# Patient Record
Sex: Female | Born: 1980
Health system: Southern US, Community
[De-identification: ages and names within clinical notes are randomized; demographics above are authoritative.]

## PROBLEM LIST (undated history)

## (undated) DIAGNOSIS — D72829 Elevated white blood cell count, unspecified: Secondary | ICD-10-CM

## (undated) DIAGNOSIS — R030 Elevated blood-pressure reading, without diagnosis of hypertension: Secondary | ICD-10-CM

## (undated) DIAGNOSIS — F419 Anxiety disorder, unspecified: Secondary | ICD-10-CM

## (undated) DIAGNOSIS — J309 Allergic rhinitis, unspecified: Secondary | ICD-10-CM

## (undated) DIAGNOSIS — T7840XA Allergy, unspecified, initial encounter: Secondary | ICD-10-CM

## (undated) DIAGNOSIS — J45909 Unspecified asthma, uncomplicated: Secondary | ICD-10-CM

## (undated) DIAGNOSIS — D649 Anemia, unspecified: Secondary | ICD-10-CM

## (undated) DIAGNOSIS — M25519 Pain in unspecified shoulder: Secondary | ICD-10-CM

## (undated) DIAGNOSIS — Z8781 Personal history of (healed) traumatic fracture: Secondary | ICD-10-CM

## (undated) DIAGNOSIS — E559 Vitamin D deficiency, unspecified: Secondary | ICD-10-CM

## (undated) DIAGNOSIS — R000 Tachycardia, unspecified: Secondary | ICD-10-CM

## (undated) DIAGNOSIS — K219 Gastro-esophageal reflux disease without esophagitis: Secondary | ICD-10-CM

## (undated) HISTORY — DX: Elevated blood-pressure reading, without diagnosis of hypertension: R03.0

## (undated) HISTORY — DX: Pain in unspecified shoulder: M25.519

## (undated) HISTORY — DX: Unspecified asthma, uncomplicated: J45.909

## (undated) HISTORY — DX: Allergy, unspecified, initial encounter: T78.40XA

## (undated) HISTORY — DX: Allergic rhinitis, unspecified: J30.9

## (undated) HISTORY — DX: Personal history of (healed) traumatic fracture: Z87.81

## (undated) HISTORY — DX: Elevated white blood cell count, unspecified: D72.829

## (undated) HISTORY — DX: Vitamin D deficiency, unspecified: E55.9

## (undated) HISTORY — DX: Gastro-esophageal reflux disease without esophagitis: K21.9

## (undated) HISTORY — DX: Anemia, unspecified: D64.9

---

## 2005-12-10 ENCOUNTER — Ambulatory Visit: Payer: Self-pay | Admitting: Family Medicine

## 2007-05-23 ENCOUNTER — Emergency Department: Payer: Self-pay | Admitting: Emergency Medicine

## 2007-05-23 ENCOUNTER — Other Ambulatory Visit: Payer: Self-pay

## 2011-06-26 ENCOUNTER — Emergency Department: Payer: Self-pay | Admitting: Emergency Medicine

## 2011-06-27 ENCOUNTER — Encounter: Payer: Self-pay | Admitting: Cardiovascular Disease

## 2011-06-27 ENCOUNTER — Ambulatory Visit (INDEPENDENT_AMBULATORY_CARE_PROVIDER_SITE_OTHER): Payer: BC Managed Care – PPO | Admitting: Cardiovascular Disease

## 2011-06-27 VITALS — BP 100/72 | HR 113 | Ht 66.0 in | Wt 155.0 lb

## 2011-06-27 DIAGNOSIS — R002 Palpitations: Secondary | ICD-10-CM

## 2011-06-27 DIAGNOSIS — R000 Tachycardia, unspecified: Secondary | ICD-10-CM

## 2011-06-27 MED ORDER — PROPRANOLOL HCL 20 MG PO TABS: 20.0000 mg | ORAL_TABLET | Freq: Three times a day (TID) | ORAL | Status: DC | PRN

## 2011-06-27 NOTE — Assessment & Plan Note (Signed)
She appears to have sinus tachycardia on EKG. I suspect this is secondary to underlying stress. As I reassured her that clinically she was doing well, her heart did seem to improve with a slower rate. We talked about each of the issues and she does have significant stress that is ongoing. I would agree with Ativan p.r.n. And I have given her a prescription for propranolol p.r.n.Marland Kitchen She can try 10 mg or 20 mg with titration upwards if needed for rate control. No additional workup is probably needed at this time given that the EKG is essentially benign apart from the rate and clinically on auscultation everything sounds appropriate. I think there is no sign of significant LVH or hypertrophy on EKG or clinical exam ( no murmur to suggest HOCM). I do not think that further workup is needed at this time she starts having additional symptoms.

## 2011-06-27 NOTE — Patient Instructions (Signed)
You are doing well. Please try propranolol as needed. You could start 1/2 pill to start. You can take up to a full pill 3 to 4 times a day.  Please call us if you have new issues that need to be addressed before your next appt.

## 2011-06-27 NOTE — Progress Notes (Signed)
Patient ID: Mackenzie Shea, female    DOB: 1980-09-20, 30 y.o.   MRN: [id number]  HPI Comments: Mackenzie Shea is a very pleasant 30 year old woman who presents by referral from Dr. Carlynn Purl for tachycardia.   She reports that she has had significant stress recently. She is taking care of her grandfather at home though recently he has been in the hospital for one month with several tumors that have had to be resected. He has had other complications and has required additional surgery. She has been much of her month in the hospital though now has gone back to work and does spend some of her evenings in the hospital.  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 This happened this week. Her sleep has been poor secondary to stress. She did take Xopenex yesterday for mild asthma but today it is much better.   She was told that her heart rate is very elevated and she is concerned given her family history of sudden death. She does have occasional palpitations that feels like a flip-flop. She was given Ativan p.r.n. For anxiety and this has helped to relieve some of her stress.  EKG today showsSinus tachycardia with rate 100 beats per minute with no significant ST or T wave changes   Outpatient Encounter Prescriptions as of 06/27/2011  Medication Sig Dispense Refill  . cyclobenzaprine (FLEXERIL) 10 MG tablet Take 10 mg by mouth 3 (three) times daily as needed.        . fluticasone (VERAMYST) 27.5 MCG/SPRAY nasal spray Place 2 sprays into the nose daily.        Marland Kitchen levalbuterol (XOPENEX) 1.25 MG/3ML nebulizer solution Take 1.25 mg by nebulization every 4 (four) hours as needed.        . montelukast (SINGULAIR) 10 MG tablet Take 10 mg by mouth at bedtime.        Marland Kitchen omeprazole (PRILOSEC) 20 MG capsule Take 20 mg by mouth daily.        . ranitidine (ZANTAC) 150 MG tablet Take 150 mg by mouth 2 (two) times daily.        .  traMADol-acetaminophen (ULTRACET) 37.5-325 MG per tablet Take 1 tablet by mouth every 6 (six) hours as needed.           Review of Systems  Constitutional: Negative.   HENT: Negative.   Eyes: Negative.   Respiratory: Negative.   Cardiovascular: Positive for palpitations.  Gastrointestinal: Negative.   Musculoskeletal: Negative.   Skin: Negative.   Neurological: Negative.   Hematological: Negative.   Psychiatric/Behavioral: The patient is nervous/anxious.   All other systems reviewed and are negative.    BP 100/72  Pulse 113  Ht 5\' 6"  (1.676 m)  Wt 155 lb (70.308 kg)  BMI 25.02 kg/m2  Physical Exam  Nursing note and vitals reviewed. Constitutional: She is oriented to person, place, and time. She appears well-developed and well-nourished.  HENT:  Head: Normocephalic.  Nose: Nose normal.  Mouth/Throat: Oropharynx is clear and moist.  Eyes: Conjunctivae are normal. Pupils are equal, round, and reactive to light.  Neck: Normal range of motion. Neck supple. No JVD present.  Cardiovascular: Regular rhythm, S1 normal, S2 normal, normal heart sounds and intact distal pulses.  Tachycardia present.  Exam reveals no gallop and no friction rub.   No murmur heard. Pulmonary/Chest: Effort normal and breath sounds normal. No respiratory distress. She has no wheezes. She has  no rales. She exhibits no tenderness.  Abdominal: Soft. Bowel sounds are normal. She exhibits no distension. There is no tenderness.  Musculoskeletal: Normal range of motion. She exhibits no edema and no tenderness.  Lymphadenopathy:    She has no cervical adenopathy.  Neurological: She is alert and oriented to person, place, and time. Coordination normal.  Skin: Skin is warm and dry. No rash noted. No erythema.  Psychiatric: She has a normal mood and affect. Her behavior is normal. Judgment and thought content normal.         Assessment and Plan

## 2011-08-28 ENCOUNTER — Ambulatory Visit: Payer: BC Managed Care – PPO | Admitting: Family Medicine

## 2011-08-29 ENCOUNTER — Ambulatory Visit: Payer: BC Managed Care – PPO | Admitting: Family Medicine

## 2011-10-02 ENCOUNTER — Ambulatory Visit (INDEPENDENT_AMBULATORY_CARE_PROVIDER_SITE_OTHER): Payer: BC Managed Care – PPO | Admitting: Family Medicine

## 2011-10-02 ENCOUNTER — Other Ambulatory Visit (HOSPITAL_COMMUNITY)
Admission: RE | Admit: 2011-10-02 | Discharge: 2011-10-02 | Disposition: A | Discharge status: Home / Self Care | Payer: BC Managed Care – PPO | Source: Ambulatory Visit | Attending: Family Medicine | Admitting: Family Medicine

## 2011-10-02 ENCOUNTER — Encounter: Payer: Self-pay | Admitting: *Deleted

## 2011-10-02 ENCOUNTER — Encounter: Payer: Self-pay | Admitting: Family Medicine

## 2011-10-02 VITALS — BP 132/82 | HR 98 | Temp 98.3°F | Ht 66.0 in | Wt 157.0 lb

## 2011-10-02 DIAGNOSIS — E559 Vitamin D deficiency, unspecified: Secondary | ICD-10-CM | POA: Insufficient documentation

## 2011-10-02 DIAGNOSIS — Z0000 Encounter for general adult medical examination without abnormal findings: Secondary | ICD-10-CM | POA: Insufficient documentation

## 2011-10-02 DIAGNOSIS — R102 Pelvic and perineal pain: Secondary | ICD-10-CM

## 2011-10-02 DIAGNOSIS — Z01419 Encounter for gynecological examination (general) (routine) without abnormal findings: Secondary | ICD-10-CM | POA: Insufficient documentation

## 2011-10-02 DIAGNOSIS — Z136 Encounter for screening for cardiovascular disorders: Secondary | ICD-10-CM

## 2011-10-02 DIAGNOSIS — R109 Unspecified abdominal pain: Secondary | ICD-10-CM

## 2011-10-02 DIAGNOSIS — Z1159 Encounter for screening for other viral diseases: Secondary | ICD-10-CM | POA: Insufficient documentation

## 2011-10-02 LAB — POCT URINALYSIS DIPSTICK
Bilirubin, UA: NEGATIVE
Blood, UA: NEGATIVE
Glucose, UA: NEGATIVE
Ketones, UA: NEGATIVE
Leukocytes, UA: NEGATIVE
Nitrite, UA: NEGATIVE
Protein, UA: NEGATIVE
Spec Grav, UA: 1.02
Urobilinogen, UA: NEGATIVE
pH, UA: 6

## 2011-10-02 LAB — LIPID PANEL
Cholesterol: 143 mg/dL (ref 0–200)
HDL: 50.2 mg/dL
LDL Cholesterol: 81 mg/dL (ref 0–99)
Total CHOL/HDL Ratio: 3
Triglycerides: 61 mg/dL (ref 0.0–149.0)
VLDL: 12.2 mg/dL (ref 0.0–40.0)

## 2011-10-02 LAB — BASIC METABOLIC PANEL
BUN: 9 mg/dL (ref 6–23)
Creatinine, Ser: 1.1 mg/dL (ref 0.4–1.2)
GFR: 77.37 mL/min (ref >60.00)
Glucose, Bld: 95 mg/dL (ref 70–99)
Potassium: 4.4 mEq/L (ref 3.5–5.1)

## 2011-10-02 MED ORDER — CITALOPRAM HYDROBROMIDE 10 MG PO TABS: 10.0000 mg | ORAL_TABLET | Freq: Every day | ORAL | Status: DC

## 2011-10-02 MED ORDER — ALPRAZOLAM 0.25 MG PO TABS: 0.2500 mg | ORAL_TABLET | Freq: Two times a day (BID) | ORAL | Status: DC | PRN

## 2011-10-02 NOTE — Progress Notes (Signed)
Subjective:    Patient ID: Mackenzie Shea, female    DOB: 1981/01/02, 31 y.o.   MRN: [id number]  HPI  31 yo here to establish care and for CPX.  G0, virginal.  Never had a pap smear.  Had some palpitations in October, saw Dr. Mariah Milling.  Felt it was secondary to stress as grandfather was dying. Has had no recurrent episodes.  Has had some suprapubic pressure last few days.  No dysuria, no back pain, no n/v/d.  Has not had cholesterol checked in years.  Vit D deficiency- was previously taking 2000 IU daily.  Has not been taking it in awhile.  Asthma- has been stable.  On Veramyst and Singulair with as needed xopenex.  Patient Active Problem List  Diagnoses  . Tachycardia  . Vitamin d deficiency  . Routine general medical examination at a health care facility   Past Medical History  Diagnosis Date  . Personal history of traumatic fracture   . Esophageal reflux   . Extrinsic asthma, unspecified   . Unspecified vitamin D deficiency   . Allergic rhinitis, cause unspecified   . Leukocytosis, unspecified   . Elevated blood pressure reading without diagnosis of hypertension   . Pain in joint, shoulder region    No past surgical history on file. History  Substance Use Topics  . Smoking status: Never Smoker   . Smokeless tobacco: Not on file  . Alcohol Use: No   No family history on file. Allergies  Allergen Reactions  . Penicillins Hives   Current Outpatient Prescriptions on File Prior to Visit  Medication Sig Dispense Refill  . cyclobenzaprine (FLEXERIL) 10 MG tablet Take 10 mg by mouth 3 (three) times daily as needed.        . fluticasone (VERAMYST) 27.5 MCG/SPRAY nasal spray Place 2 sprays into the nose daily.        Marland Kitchen levalbuterol (XOPENEX) 1.25 MG/3ML nebulizer solution Take 1.25 mg by nebulization every 4 (four) hours as needed.        . montelukast (SINGULAIR) 10 MG tablet Take 10 mg by mouth at bedtime.        Marland Kitchen omeprazole (PRILOSEC) 20 MG capsule Take 20 mg by mouth  daily.        . propranolol (INDERAL) 20 MG tablet Take 1 tablet (20 mg total) by mouth 3 (three) times daily as needed.  90 tablet  6  . ranitidine (ZANTAC) 150 MG tablet Take 150 mg by mouth 2 (two) times daily.        . traMADol-acetaminophen (ULTRACET) 37.5-325 MG per tablet Take 1 tablet by mouth every 6 (six) hours as needed.         The PMH, PSH, Social History, Family History, Medications, and allergies have been reviewed in [facility], and have been updated if relevant.   Review of Systems    See HPI Patient reports no  vision/ hearing changes,anorexia, weight change, fever ,adenopathy, persistant / recurrent hoarseness, swallowing issues, chest pain, edema,persistant / recurrent cough, hemoptysis, dyspnea(rest, exertional, paroxysmal nocturnal), gastrointestinal  bleeding (melena, rectal bleeding), abdominal pain, excessive heart burn, GU symptoms(dysuria, hematuria, pyuria, voiding/incontinence  Issues) syncope, focal weakness, severe memory loss, concerning skin lesions, depression, anxiety, abnormal bruising/bleeding, major joint swelling, breast masses or abnormal vaginal bleeding.    Objective:   Physical Exam BP 132/82  Pulse 98  Temp(Src) 98.3 F (36.8 C) (Oral)  Ht 5\' 6"  (1.676 m)  Wt 157 lb (71.215 kg)  BMI 25.34 kg/m2  LMP 09/01/2011  General:  Well-developed,well-nourished,in no acute distress; alert,appropriate and cooperative throughout examination Head:  normocephalic and atraumatic.   Eyes:  vision grossly intact, pupils equal, pupils round, and pupils reactive to light.   Ears:  R ear normal and L ear normal.   Nose:  no external deformity.   Mouth:  good dentition.   Neck:  No deformities, masses, or tenderness noted. Breasts:  No mass, nodules, thickening, tenderness, bulging, retraction, inflamation, nipple discharge or skin changes noted.   Lungs:  Normal respiratory effort, chest expands symmetrically. Lungs are clear to auscultation, no crackles or  wheezes. Heart:  Normal rate and regular rhythm. S1 and S2 normal without gallop, murmur, click, rub or other extra sounds. Abdomen:  Bowel sounds positive,abdomen soft and non-tender without masses, organomegaly or hernias noted. Rectal:  no external abnormalities.   Genitalia:  Pelvic Exam:        External: normal female genitalia without lesions or masses        Vagina: normal without lesions or masses        Cervix: normal without lesions or masses        Adnexa: normal bimanual exam without masses or fullness        Uterus: normal by palpation        Pap smear: performed Msk:  No deformity or scoliosis noted of thoracic or lumbar spine.   Extremities:  No clubbing, cyanosis, edema, or deformity noted with normal full range of motion of all joints.   Neurologic:  alert & oriented X3 and gait normal.   Skin:  Intact without suspicious lesions or rashes Cervical Nodes:  No lymphadenopathy noted Axillary Nodes:  No palpable lymphadenopathy Psych:  Cognition and judgment appear intact. Alert and cooperative with normal attention span and concentration. No apparent delusions, illusions, hallucinations  Assessment and Plan: 1. Vitamin d deficiency  Vitamin D (25 hydroxy)  2. Routine general medical examination at a health care facility   Reviewed preventive care protocols, scheduled due services, and updated immunizations Discussed nutrition, exercise, diet, and healthy lifestyle.  Lipid Profile Basic Metabolic Panel (BMET), Cytology -Pap Smear  3. Suprapubic pressure   UA neg.  Advised to continue to monitor.

## 2011-10-02 NOTE — Patient Instructions (Signed)
It was so nice to meet you. We will call you with the results of your labs and your pap smear (pap smear results can take up to a week).

## 2011-10-03 ENCOUNTER — Encounter: Payer: Self-pay | Admitting: *Deleted

## 2011-10-03 LAB — VITAMIN D 25 HYDROXY (VIT D DEFICIENCY, FRACTURES): Vit D, 25-Hydroxy: 42 ng/mL (ref 30–89)

## 2011-10-09 ENCOUNTER — Encounter: Payer: Self-pay | Admitting: *Deleted

## 2011-11-03 ENCOUNTER — Encounter: Payer: Self-pay | Admitting: Family Medicine

## 2011-11-03 ENCOUNTER — Ambulatory Visit (INDEPENDENT_AMBULATORY_CARE_PROVIDER_SITE_OTHER): Payer: BC Managed Care – PPO | Admitting: Family Medicine

## 2011-11-03 VITALS — BP 130/88 | HR 84 | Temp 98.4°F | Wt 156.0 lb

## 2011-11-03 DIAGNOSIS — J329 Chronic sinusitis, unspecified: Secondary | ICD-10-CM

## 2011-11-03 DIAGNOSIS — J029 Acute pharyngitis, unspecified: Secondary | ICD-10-CM

## 2011-11-03 LAB — POCT RAPID STREP A (OFFICE): Rapid Strep A Screen: NEGATIVE

## 2011-11-03 MED ORDER — AZITHROMYCIN 250 MG PO TABS: ORAL_TABLET | ORAL | Status: AC

## 2011-11-03 NOTE — Progress Notes (Signed)
SUBJECTIVE:  Mackenzie Shea is a 31 y.o. female who complains of coryza, congestion, sore throat, dry cough and bilateral sinus pain for 21 days. She denies a history of anorexia and chest pain and denies a history of asthma. Patient denies smoke cigarettes.   Patient Active Problem List  Diagnoses  . Tachycardia  . Vitamin d deficiency  . Routine general medical examination at a health care facility  . Suprapubic pressure  . Sinusitis   Past Medical History  Diagnosis Date  . Personal history of traumatic fracture   . Esophageal reflux   . Extrinsic asthma, unspecified   . Unspecified vitamin D deficiency   . Allergic rhinitis, cause unspecified   . Leukocytosis, unspecified   . Elevated blood pressure reading without diagnosis of hypertension   . Pain in joint, shoulder region    No past surgical history on file. History  Substance Use Topics  . Smoking status: Never Smoker   . Smokeless tobacco: Not on file  . Alcohol Use: No   No family history on file. Allergies  Allergen Reactions  . Penicillins Hives   Current Outpatient Prescriptions on File Prior to Visit  Medication Sig Dispense Refill  . cetirizine (ZYRTEC) 10 MG tablet Take 10 mg by mouth at bedtime.      . Cholecalciferol (VITAMIN D3) 2000 UNITS TABS Take 1 tablet by mouth daily.      . cyclobenzaprine (FLEXERIL) 10 MG tablet Take 10 mg by mouth 3 (three) times daily as needed.        . fluticasone (VERAMYST) 27.5 MCG/SPRAY nasal spray Place 2 sprays into the nose daily.        . Fluticasone-Salmeterol (ADVAIR DISKUS) 100-50 MCG/DOSE AEPB Inhale 1 puff into the lungs 2 (two) times daily.      Marland Kitchen levalbuterol (XOPENEX HFA) 45 MCG/ACT inhaler Inhale 1-2 puffs into the lungs every 4 (four) hours as needed.      . levalbuterol (XOPENEX) 1.25 MG/3ML nebulizer solution Take 1.25 mg by nebulization every 4 (four) hours as needed.        Marland Kitchen LORazepam (ATIVAN) 0.5 MG tablet Take 0.5 mg by mouth daily as needed.      .  meclizine (ANTIVERT) 25 MG tablet Take 25 mg by mouth 3 (three) times daily as needed.      . montelukast (SINGULAIR) 10 MG tablet Take 10 mg by mouth at bedtime.        . naproxen (NAPROSYN) 500 MG tablet Take 500 mg by mouth 2 (two) times daily with a meal.      . omeprazole (PRILOSEC) 20 MG capsule Take 20 mg by mouth daily.        . propranolol (INDERAL) 20 MG tablet Take 1 tablet (20 mg total) by mouth 3 (three) times daily as needed.  90 tablet  6  . ranitidine (ZANTAC) 150 MG tablet Take 150 mg by mouth 2 (two) times daily.        . traMADol-acetaminophen (ULTRACET) 37.5-325 MG per tablet Take 1 tablet by mouth every 6 (six) hours as needed.         The PMH, PSH, Social History, Family History, Medications, and allergies have been reviewed in [facility], and have been updated if relevant.  OBJECTIVE: BP 130/88  Pulse 84  Temp(Src) 98.4 F (36.9 C) (Oral)  Wt 156 lb (70.761 kg)  She appears well, vital signs are as noted. Ears normal.  Throat and pharynx normal.  Neck supple. No adenopathy in the  neck. Nose is congested. Sinuses tender throughout. The chest is clear, without wheezes or rales.  ASSESSMENT:  sinusitis  PLAN: Given duration and progression of symptoms, will treat for bacterial sinusitis with Zpack (PCN allergic). Symptomatic therapy suggested: push fluids, rest and return office visit prn if symptoms persist or worsen.  Call or return to clinic prn if these symptoms worsen or fail to improve as anticipated.

## 2011-11-03 NOTE — Patient Instructions (Signed)
Take antibiotic as directed.  Drink lots of fluids.  Treat sympotmatically with Mucinex, nasal saline irrigation, and Tylenol/Ibuprofen. Also try claritin D or zyrtec D over the counter- two times a day as needed ( have to sign for them at pharmacy). You can use warm compresses.  Cough suppressant at night. Call if not improving as expected in 5-7 days.    

## 2011-11-03 NOTE — Progress Notes (Signed)
Addended by: Eliezer Bottom on: 11/03/2011 10:22 AM   Modules accepted: Orders

## 2011-12-25 ENCOUNTER — Other Ambulatory Visit: Payer: Self-pay

## 2011-12-25 MED ORDER — LEVALBUTEROL TARTRATE 45 MCG/ACT IN AERO: 1.0000 | INHALATION_SPRAY | RESPIRATORY_TRACT | Status: DC | PRN

## 2011-12-25 NOTE — Telephone Encounter (Signed)
Pt request refill Xopenex HFA 45 mcg/act inhaler  #1 x 3.sent to [facility] [address]. Pt seen 11/03/11 and forgot to get refills to have on hand if needed. Pt said not having a problem just wanted to have on hand.Pt notified sent in while on phone.

## 2012-02-18 ENCOUNTER — Ambulatory Visit (INDEPENDENT_AMBULATORY_CARE_PROVIDER_SITE_OTHER): Payer: BC Managed Care – PPO | Admitting: Family Medicine

## 2012-02-18 ENCOUNTER — Encounter: Payer: Self-pay | Admitting: Family Medicine

## 2012-02-18 VITALS — BP 142/80 | HR 92 | Temp 98.2°F | Wt 158.0 lb

## 2012-02-18 DIAGNOSIS — R21 Rash and other nonspecific skin eruption: Secondary | ICD-10-CM

## 2012-02-18 MED ORDER — PREDNISONE 20 MG PO TABS: ORAL_TABLET | ORAL | Status: DC

## 2012-02-18 MED ORDER — LORAZEPAM 0.5 MG PO TABS: 0.5000 mg | ORAL_TABLET | Freq: Every day | ORAL | Status: DC | PRN

## 2012-02-18 MED ORDER — MECLIZINE HCL 25 MG PO TABS: 25.0000 mg | ORAL_TABLET | Freq: Three times a day (TID) | ORAL | Status: DC | PRN

## 2012-02-18 MED ORDER — LEVALBUTEROL TARTRATE 45 MCG/ACT IN AERO: 1.0000 | INHALATION_SPRAY | RESPIRATORY_TRACT | Status: DC | PRN

## 2012-02-18 MED ORDER — FLUOCINONIDE-E 0.05 % EX CREA: TOPICAL_CREAM | Freq: Two times a day (BID) | CUTANEOUS | Status: DC

## 2012-02-18 NOTE — Progress Notes (Signed)
Subjective:    Patient ID: Mackenzie Shea, female    DOB: 06-24-1981, 31 y.o.   MRN: [id number]  HPI  31 yo here for itchy rash.  Went to beach over the weekend and returned with diffuse, papular, itchy rash. Symptoms mildly improve with Benadryl and OTC cortisone cream.  No one else who stayed with her has similar symptoms.  She was swimming a lot in pool and has sensitivities to chlorine.  Patient Active Problem List  Diagnosis  . Tachycardia  . Vitamin d deficiency  . Routine general medical examination at a health care facility  . Suprapubic pressure  . Sinusitis  . Rash and nonspecific skin eruption   Past Medical History  Diagnosis Date  . Personal history of traumatic fracture   . Esophageal reflux   . Extrinsic asthma, unspecified   . Unspecified vitamin D deficiency   . Allergic rhinitis, cause unspecified   . Leukocytosis, unspecified   . Elevated blood pressure reading without diagnosis of hypertension   . Pain in joint, shoulder region    No past surgical history on file. History  Substance Use Topics  . Smoking status: Never Smoker   . Smokeless tobacco: Not on file  . Alcohol Use: No   No family history on file. Allergies  Allergen Reactions  . Penicillins Hives   Current Outpatient Prescriptions on File Prior to Visit  Medication Sig Dispense Refill  . cetirizine (ZYRTEC) 10 MG tablet Take 10 mg by mouth at bedtime.      . Cholecalciferol (VITAMIN D3) 2000 UNITS TABS Take 1 tablet by mouth daily.      . cyclobenzaprine (FLEXERIL) 10 MG tablet Take 10 mg by mouth 3 (three) times daily as needed.        . fluticasone (VERAMYST) 27.5 MCG/SPRAY nasal spray Place 2 sprays into the nose daily.        . Fluticasone-Salmeterol (ADVAIR DISKUS) 100-50 MCG/DOSE AEPB Inhale 1 puff into the lungs 2 (two) times daily.      Marland Kitchen levalbuterol (XOPENEX) 1.25 MG/3ML nebulizer solution Take 1.25 mg by nebulization every 4 (four) hours as needed.        . montelukast  (SINGULAIR) 10 MG tablet Take 10 mg by mouth at bedtime.        Marland Kitchen omeprazole (PRILOSEC) 20 MG capsule Take 20 mg by mouth daily.        . propranolol (INDERAL) 20 MG tablet Take 1 tablet (20 mg total) by mouth 3 (three) times daily as needed.  90 tablet  6  . ranitidine (ZANTAC) 150 MG tablet Take 150 mg by mouth 2 (two) times daily.        Marland Kitchen DISCONTD: levalbuterol (XOPENEX HFA) 45 MCG/ACT inhaler Inhale 1-2 puffs into the lungs every 4 (four) hours as needed.  1 Inhaler  3   The PMH, PSH, Social History, Family History, Medications, and allergies have been reviewed in [facility], and have been updated if relevant.   Review of Systems See HPI No wheezing    Objective:   Physical Exam  Constitutional: She appears well-developed and well-nourished. No distress.  HENT:  Head: Normocephalic.  Skin: Skin is warm, dry and intact. Rash noted. Rash is urticarial.     Psychiatric: She has a normal mood and affect. Her behavior is normal.   BP 142/80  Pulse 92  Temp 98.2 F (36.8 C) (Oral)  Wt 158 lb (71.668 kg)       Assessment & Plan:   1.  Rash and nonspecific skin eruption    New- appears allergic. ?reaction to chlorine. Given diffuse nature, will give oral prednisone taper, continue antihistamines. If no improvement by Friday, consider ?scabies or bed bugs, although not classic distribution or lesions- no one else who stayed in hotel have these lesions which makes it less likely as well.

## 2012-02-18 NOTE — Patient Instructions (Addendum)
I think this is an allergic reaction( probably to chlorine). Take prednisone as directed. Use lidex as directed. Continue benadrly and or zyrtec. If this is no better by Friday, call me, we will treat for scabies.

## 2012-02-27 ENCOUNTER — Telehealth: Payer: Self-pay

## 2012-02-27 MED ORDER — PERMETHRIN 5 % EX CREA: TOPICAL_CREAM | CUTANEOUS | Status: DC

## 2012-02-27 NOTE — Telephone Encounter (Signed)
I completely agree. Scabies was on the differential so if she is no better, we do need to treat for scabies. I will send rx to her pharmacy.

## 2012-02-27 NOTE — Telephone Encounter (Signed)
Advised patient. She will call back if problems continue.

## 2012-02-27 NOTE — Telephone Encounter (Signed)
Pt seen 02/18/12; rash no better, new areas of rash also. Pt finished med; thinks needs med for scabies. [facility] [address]Please advise.

## 2012-03-01 ENCOUNTER — Other Ambulatory Visit: Payer: Self-pay | Admitting: Family Medicine

## 2012-04-22 ENCOUNTER — Other Ambulatory Visit: Payer: Self-pay | Admitting: *Deleted

## 2012-04-22 MED ORDER — MONTELUKAST SODIUM 10 MG PO TABS: 10.0000 mg | ORAL_TABLET | Freq: Every day | ORAL | Status: DC

## 2012-05-04 ENCOUNTER — Other Ambulatory Visit: Payer: Self-pay | Admitting: *Deleted

## 2012-05-04 MED ORDER — OMEPRAZOLE 20 MG PO CPDR: 20.0000 mg | DELAYED_RELEASE_CAPSULE | Freq: Every day | ORAL | Status: DC

## 2012-09-16 ENCOUNTER — Other Ambulatory Visit: Payer: Self-pay | Admitting: Family Medicine

## 2012-11-30 ENCOUNTER — Telehealth: Payer: Self-pay | Admitting: *Deleted

## 2012-11-30 NOTE — Telephone Encounter (Signed)
Prior Berkley Harvey is needed for omeprazole, form is on your desk.

## 2012-12-01 NOTE — Telephone Encounter (Signed)
Please get more information from pt.

## 2012-12-01 NOTE — Telephone Encounter (Signed)
Spoke with patient.  She states she takes this everyday for reflux- which can be severe if she doesn't take the medicine.  Says she has previously tired zantac, prevacid and nexium, but the omeprazole works best.  States she has been taking this for about 3 years.

## 2012-12-02 NOTE — Telephone Encounter (Signed)
Thank you. Completed and in my box.

## 2012-12-02 NOTE — Telephone Encounter (Signed)
Prior auth given for omeprazole, advised pharmacy, approval letter placed on doctor's desk for signature and scanning.

## 2012-12-02 NOTE — Telephone Encounter (Signed)
Form faxed

## 2012-12-17 ENCOUNTER — Other Ambulatory Visit: Payer: Self-pay | Admitting: Family Medicine

## 2012-12-28 ENCOUNTER — Ambulatory Visit (INDEPENDENT_AMBULATORY_CARE_PROVIDER_SITE_OTHER): Payer: BC Managed Care – PPO | Admitting: Family Medicine

## 2012-12-28 ENCOUNTER — Ambulatory Visit
Admission: RE | Admit: 2012-12-28 | Discharge: 2012-12-28 | Disposition: A | Discharge status: Home / Self Care | Payer: BC Managed Care – PPO | Source: Ambulatory Visit | Attending: Family Medicine | Admitting: Family Medicine

## 2012-12-28 ENCOUNTER — Encounter: Payer: Self-pay | Admitting: Family Medicine

## 2012-12-28 VITALS — BP 110/80 | HR 76 | Temp 97.9°F | Ht 66.0 in | Wt 159.0 lb

## 2012-12-28 DIAGNOSIS — N926 Irregular menstruation, unspecified: Secondary | ICD-10-CM

## 2012-12-28 DIAGNOSIS — Z23 Encounter for immunization: Secondary | ICD-10-CM

## 2012-12-28 DIAGNOSIS — Z136 Encounter for screening for cardiovascular disorders: Secondary | ICD-10-CM

## 2012-12-28 DIAGNOSIS — Z111 Encounter for screening for respiratory tuberculosis: Secondary | ICD-10-CM

## 2012-12-28 DIAGNOSIS — E559 Vitamin D deficiency, unspecified: Secondary | ICD-10-CM

## 2012-12-28 DIAGNOSIS — Z0000 Encounter for general adult medical examination without abnormal findings: Secondary | ICD-10-CM

## 2012-12-28 DIAGNOSIS — J45909 Unspecified asthma, uncomplicated: Secondary | ICD-10-CM

## 2012-12-28 DIAGNOSIS — J4520 Mild intermittent asthma, uncomplicated: Secondary | ICD-10-CM | POA: Insufficient documentation

## 2012-12-28 LAB — COMPREHENSIVE METABOLIC PANEL
ALT: 20 U/L (ref 0–35)
CO2: 27 mEq/L (ref 19–32)
Calcium: 9.1 mg/dL (ref 8.4–10.5)
Chloride: 103 mEq/L (ref 96–112)
Creatinine, Ser: 1 mg/dL (ref 0.4–1.2)
GFR: 79.36 mL/min (ref >60.00)
Glucose, Bld: 83 mg/dL (ref 70–99)
Total Bilirubin: 0.6 mg/dL (ref 0.3–1.2)
Total Protein: 7.4 g/dL (ref 6.0–8.3)

## 2012-12-28 LAB — LIPID PANEL: Cholesterol: 159 mg/dL (ref 0–200)

## 2012-12-28 LAB — TSH: TSH: 0.75 u[IU]/mL (ref 0.35–5.50)

## 2012-12-28 LAB — T4, FREE: Free T4: 0.79 ng/dL (ref 0.60–1.60)

## 2012-12-28 NOTE — Patient Instructions (Addendum)
Good to see you. We will call you with your lab results.  Please stop by to see Mackenzie Shea on your way out to set up your referral.  

## 2012-12-28 NOTE — Progress Notes (Signed)
Subjective:    Patient ID: Mackenzie Shea, female    DOB: 1980/10/05, 32 y.o.   MRN: [id number]  HPI  32 yo pleasant female here for CPX.  G0, virginal.  First pap smear was done by me last year in 09/2011- normal.  No dysuria or vaginal discharge. Periods have been a little heavier lately but nothing "severe."  No dizziness when she stands from seated position or fatigue.   Lab Results  Component Value Date   CHOL 143 10/02/2011   HDL 50.20 10/02/2011   LDLCALC 81 10/02/2011   TRIG 61.0 10/02/2011   CHOLHDL 3 10/02/2011    Vit D deficiency- was previously taking 2000 IU daily.  Has not been taking it in awhile. Vit D was 42 in 09/2011.  Asthma- has been stable.  On Veramyst and Singulair with as needed xopenex.  Doing well- spring allergens typically her trigger but this year, symptoms controlled.  Patient Active Problem List   Diagnosis Date Noted  . Vitamin D deficiency 10/02/2011  . Routine general medical examination at a health care facility 10/02/2011  . Tachycardia 06/27/2011   Past Medical History  Diagnosis Date  . Personal history of traumatic fracture   . Esophageal reflux   . Extrinsic asthma, unspecified   . Unspecified vitamin D deficiency   . Allergic rhinitis, cause unspecified   . Leukocytosis, unspecified   . Elevated blood pressure reading without diagnosis of hypertension   . Pain in joint, shoulder region    No past surgical history on file. History  Substance Use Topics  . Smoking status: Never Smoker   . Smokeless tobacco: Not on file  . Alcohol Use: No   No family history on file. Allergies  Allergen Reactions  . Penicillins Hives   Current Outpatient Prescriptions on File Prior to Visit  Medication Sig Dispense Refill  . cetirizine (ZYRTEC) 10 MG tablet Take 10 mg by mouth at bedtime.      . Cholecalciferol (VITAMIN D3) 2000 UNITS TABS Take 1 tablet by mouth daily.      . cyclobenzaprine (FLEXERIL) 10 MG tablet Take 10 mg by mouth 3 (three)  times daily as needed.        . fluocinonide-emollient (LIDEX-E) 0.05 % cream APPLY TO AFFECTED AREA TWICE DAILY  30 g  1  . fluticasone (VERAMYST) 27.5 MCG/SPRAY nasal spray Place 2 sprays into the nose daily.        . Fluticasone-Salmeterol (ADVAIR DISKUS) 100-50 MCG/DOSE AEPB Inhale 1 puff into the lungs 2 (two) times daily.      Marland Kitchen levalbuterol (XOPENEX HFA) 45 MCG/ACT inhaler Inhale 1-2 puffs into the lungs every 4 (four) hours as needed.  1 Inhaler  6  . levalbuterol (XOPENEX) 1.25 MG/3ML nebulizer solution Take 1.25 mg by nebulization every 4 (four) hours as needed.        Marland Kitchen LORazepam (ATIVAN) 0.5 MG tablet Take 1 tablet (0.5 mg total) by mouth daily as needed.  30 tablet  1  . meclizine (ANTIVERT) 25 MG tablet Take 1 tablet (25 mg total) by mouth 3 (three) times daily as needed.  30 tablet  6  . montelukast (SINGULAIR) 10 MG tablet TAKE ONE TABLET BY MOUTH AT BEDTIME *NEEDS OFFICE VISIT BEFORE FURTHER REFILLS*  30 tablet  0  . omeprazole (PRILOSEC) 20 MG capsule Take 1 capsule (20 mg total) by mouth daily.  30 capsule  6  . permethrin (ACTICIN) 5 % cream Apply cream from head to toe; leave on  for 8-14 hours before washing off with water; may reapply in 1 week if live mites appear  60 g  0  . predniSONE (DELTASONE) 20 MG tablet 3 tabs by mouth daily x 3 days, 2 tabs by mouth x 3 days, 1 tab by mouth x 2 days, half tab by mouth x 2 days and stop. Dispense qs  1 tablet  0  . ranitidine (ZANTAC) 150 MG tablet Take 150 mg by mouth 2 (two) times daily.         No current facility-administered medications on file prior to visit.   The PMH, PSH, Social History, Family History, Medications, and allergies have been reviewed in [facility], and have been updated if relevant.   Review of Systems    See HPI Patient reports no  vision/ hearing changes,anorexia, weight change, fever ,adenopathy, persistant / recurrent hoarseness, swallowing issues, chest pain, edema,persistant / recurrent cough, hemoptysis,  dyspnea(rest, exertional, paroxysmal nocturnal), gastrointestinal  bleeding (melena, rectal bleeding), abdominal pain, excessive heart burn, GU symptoms(dysuria, hematuria, pyuria, voiding/incontinence  Issues) syncope, focal weakness, severe memory loss, concerning skin lesions, depression, anxiety, abnormal bruising/bleeding, major joint swelling, breast masses or abnormal vaginal bleeding.    Objective:   Physical Exam BP 110/80  Pulse 76  Temp(Src) 97.9 F (36.6 C)  Ht 5\' 6"  (1.676 m)  Wt 159 lb (72.122 kg)  BMI 25.68 kg/m2  General:  Well-developed,well-nourished,in no acute distress; alert,appropriate and cooperative throughout examination Head:  normocephalic and atraumatic.   Eyes:  vision grossly intact, pupils equal, pupils round, and pupils reactive to light.   Ears:  R ear normal and L ear normal.   Nose:  no external deformity.   Mouth:  good dentition.   Neck:  No deformities, masses, or tenderness noted. Breasts:  No mass, nodules, thickening, tenderness, bulging, retraction, inflamation, nipple discharge or skin changes noted.   Lungs:  Normal respiratory effort, chest expands symmetrically. Lungs are clear to auscultation, no crackles or wheezes. Heart:  Normal rate and regular rhythm. S1 and S2 normal without gallop, murmur, click, rub or other extra sounds. Abdomen:  Bowel sounds positive,abdomen soft and non-tender without masses, organomegaly or hernias noted. Uterus feels firm and large Msk:  No deformity or scoliosis noted of thoracic or lumbar spine.   Extremities:  No clubbing, cyanosis, edema, or deformity noted with normal full range of motion of all joints.   Neurologic:  alert & oriented X3 and gait normal.   Skin:  Intact without suspicious lesions or rashes Cervical Nodes:  No lymphadenopathy noted Axillary Nodes:  No palpable lymphadenopathy Psych:  Cognition and judgment appear intact. Alert and cooperative with normal attention span and concentration.  No apparent delusions, illusions, hallucinations  Assessment and Plan:  1. Vitamin D deficiency  - Vitamin D, 25-hydroxy  2. Routine general medical examination at a health care facility Reviewed preventive care protocols, scheduled due services, and updated immunizations Discussed nutrition, exercise, diet, and healthy lifestyle.  - Comprehensive metabolic panel - TSH - T4, Free  3. Asthma, chronic Well controlled  4. Screening for ischemic heart disease  - Lipid Panel  5. Irregular menstrual cycle With enlarged uterus on exam.  Virginal- ? Fibroids. Will evaluated further with pelvic US. The patient indicates understanding of these issues and agrees with the plan.  - US Pelvis Complete; Future - US Transvaginal Non-OB; Future

## 2012-12-28 NOTE — Addendum Note (Signed)
Addended by: Eliezer Bottom on: 12/28/2012 09:28 AM   Modules accepted: Orders

## 2012-12-29 ENCOUNTER — Other Ambulatory Visit: Payer: Self-pay | Admitting: Family Medicine

## 2012-12-29 DIAGNOSIS — D259 Leiomyoma of uterus, unspecified: Secondary | ICD-10-CM

## 2012-12-29 LAB — VITAMIN D 25 HYDROXY (VIT D DEFICIENCY, FRACTURES): Vit D, 25-Hydroxy: 46 ng/mL (ref 30–89)

## 2012-12-30 ENCOUNTER — Other Ambulatory Visit: Payer: BC Managed Care – PPO

## 2013-01-10 ENCOUNTER — Other Ambulatory Visit: Payer: Self-pay | Admitting: Family Medicine

## 2013-01-31 ENCOUNTER — Other Ambulatory Visit: Payer: Self-pay | Admitting: Family Medicine

## 2013-11-24 ENCOUNTER — Telehealth: Payer: Self-pay

## 2013-11-24 NOTE — Telephone Encounter (Signed)
Pt request copy of immunization record from [facility] 2013. Advised pt no immunization records found from [facility]. Pt will contact [facility].

## 2013-11-25 ENCOUNTER — Ambulatory Visit (INDEPENDENT_AMBULATORY_CARE_PROVIDER_SITE_OTHER): Payer: BC Managed Care – PPO | Admitting: Family Medicine

## 2013-11-25 ENCOUNTER — Encounter: Payer: Self-pay | Admitting: Family Medicine

## 2013-11-25 VITALS — BP 112/80 | HR 100 | Temp 98.1°F | Ht 66.0 in | Wt 162.0 lb

## 2013-11-25 DIAGNOSIS — L03213 Periorbital cellulitis: Secondary | ICD-10-CM

## 2013-11-25 DIAGNOSIS — H109 Unspecified conjunctivitis: Secondary | ICD-10-CM | POA: Insufficient documentation

## 2013-11-25 DIAGNOSIS — L03211 Cellulitis of face: Secondary | ICD-10-CM

## 2013-11-25 DIAGNOSIS — H5710 Ocular pain, unspecified eye: Secondary | ICD-10-CM

## 2013-11-25 DIAGNOSIS — L0201 Cutaneous abscess of face: Secondary | ICD-10-CM

## 2013-11-25 DIAGNOSIS — H5711 Ocular pain, right eye: Secondary | ICD-10-CM | POA: Insufficient documentation

## 2013-11-25 MED ORDER — CEFDINIR 300 MG PO CAPS: 300.0000 mg | ORAL_CAPSULE | Freq: Two times a day (BID) | ORAL | Status: DC

## 2013-11-25 MED ORDER — NEOMYCIN-POLYMYXIN-DEXAMETH 0.1 % OP SUSP: 1.0000 [drp] | Freq: Three times a day (TID) | OPHTHALMIC | Status: AC

## 2013-11-25 MED ORDER — FLUCONAZOLE 150 MG PO TABS: 150.0000 mg | ORAL_TABLET | Freq: Once | ORAL | Status: DC

## 2013-11-25 NOTE — Progress Notes (Signed)
   Subjective:    Patient ID: Mackenzie Shea, female    DOB: April 21, 1981, 33 y.o.   MRN: [id number]  Conjunctivitis  The current episode started 2 days ago. The problem has been gradually worsening. The problem is moderate. Relieved by: using allergy meds, using warm compresses. Associated symptoms include eye itching, ear pain, eye discharge, eye pain and eye redness. Pertinent negatives include no fever, no decreased vision, no double vision, no congestion, no ear discharge, no headaches, no sore throat, no swollen glands and no cough. The eye pain is mild. The right eye is affected.The eye pain is not associated with movement. The eyelid exhibits redness and swelling. There were sick contacts at home.    Twins have crusty eyes in last few days.  Review of Systems  Constitutional: Negative for fever.  HENT: Positive for ear pain. Negative for congestion, ear discharge and sore throat.   Eyes: Positive for pain, discharge, redness and itching. Negative for double vision.  Respiratory: Negative for cough.   Neurological: Negative for headaches.       Objective:   Physical Exam  Constitutional: Vital signs are normal. She appears well-developed and well-nourished. She is cooperative.  Non-toxic appearance. She does not appear ill. No distress.  HENT:  Head: Normocephalic.  Right Ear: Hearing, tympanic membrane, external ear and ear canal normal. Tympanic membrane is not erythematous, not retracted and not bulging.  Left Ear: Hearing, tympanic membrane, external ear and ear canal normal. Tympanic membrane is not erythematous, not retracted and not bulging.  Nose: No mucosal edema or rhinorrhea. Right sinus exhibits no maxillary sinus tenderness and no frontal sinus tenderness. Left sinus exhibits no maxillary sinus tenderness and no frontal sinus tenderness.  Mouth/Throat: Uvula is midline, oropharynx is clear and moist and mucous membranes are normal.  Eyes: EOM and lids are normal. Pupils  are equal, round, and reactive to light. Lids are everted and swept, no foreign bodies found. Right conjunctiva is injected.  Mild erythema around eye, upper and lower lid  Neck: Trachea normal and normal range of motion. Neck supple. Carotid bruit is not present. No mass and no thyromegaly present.  Cardiovascular: Normal rate, regular rhythm, S1 normal, S2 normal, normal heart sounds, intact distal pulses and normal pulses.  Exam reveals no gallop and no friction rub.   No murmur heard. Pulmonary/Chest: Effort normal and breath sounds normal. Not tachypneic. No respiratory distress. She has no decreased breath sounds. She has no wheezes. She has no rhonchi. She has no rales.  Abdominal: Soft. Normal appearance and bowel sounds are normal. There is no tenderness.  Lymphadenopathy:       Head (right side): Preauricular adenopathy present.  Neurological: She is alert.  Skin: Skin is warm, dry and intact. No rash noted.  Psychiatric: Her speech is normal and behavior is normal. Judgment and thought content normal. Her mood appears not anxious. Cognition and memory are normal. She does not exhibit a depressed mood.          Assessment & Plan:  Concerning for periorbital cellulitis, but also definitely has a conjunctivitis. Will start treatment with topical antibiotics. iuf not improving in 24-48 hours she will start oral antibitoics.

## 2013-11-25 NOTE — Patient Instructions (Addendum)
Start the eye drop 3 times  Day for 5 days. If redness and swelling around the eye is not improving after 48 hours.. Start the oral. Use saline drops to soothe.  Wash hands constantly. Call if not improving as expected.

## 2013-11-25 NOTE — Progress Notes (Signed)
Pre visit review using our clinic review tool, if applicable. No additional management support is needed unless otherwise documented below in the visit note. 

## 2013-12-12 ENCOUNTER — Other Ambulatory Visit: Payer: Self-pay | Admitting: Family Medicine

## 2013-12-12 MED ORDER — LEVALBUTEROL TARTRATE 45 MCG/ACT IN AERO: 1.0000 | INHALATION_SPRAY | RESPIRATORY_TRACT | Status: DC | PRN

## 2014-01-16 ENCOUNTER — Other Ambulatory Visit: Payer: Self-pay | Admitting: *Deleted

## 2014-01-16 MED ORDER — MONTELUKAST SODIUM 10 MG PO TABS: ORAL_TABLET | ORAL | Status: DC

## 2014-01-16 NOTE — Telephone Encounter (Signed)
Received faxed refill request from pharmacy. Refill sent to pharmacy electronically. 

## 2014-01-18 ENCOUNTER — Ambulatory Visit (INDEPENDENT_AMBULATORY_CARE_PROVIDER_SITE_OTHER): Payer: 59 | Admitting: Internal Medicine

## 2014-01-18 ENCOUNTER — Encounter: Payer: Self-pay | Admitting: Internal Medicine

## 2014-01-18 VITALS — BP 108/68 | HR 98 | Temp 98.7°F | Wt 165.0 lb

## 2014-01-18 DIAGNOSIS — J019 Acute sinusitis, unspecified: Secondary | ICD-10-CM

## 2014-01-18 MED ORDER — CEFUROXIME AXETIL 500 MG PO TABS: 500.0000 mg | ORAL_TABLET | Freq: Two times a day (BID) | ORAL | Status: DC

## 2014-01-18 NOTE — Progress Notes (Signed)
HPI  Pt presents to the clinic today with c/o nasal congestion cough and chest congestions. She reports this started about 3 weeks. She is blowing thick green mucous out of her nose. She denies chills and body but has had fevers. She has tried sudafed, mucinex, and honey. She does have a history of allergies and asthma. She is on zyrtec, veramyst, advair, xopenex and singulair.  She does not think she has had sick contacts.  Review of Systems      Past Medical History  Diagnosis Date  . Personal history of traumatic fracture   . Esophageal reflux   . Extrinsic asthma, unspecified   . Unspecified vitamin D deficiency   . Allergic rhinitis, cause unspecified   . Leukocytosis, unspecified   . Elevated blood pressure reading without diagnosis of hypertension   . Pain in joint, shoulder region     No family history on file.  History   Social History  . Marital Status: Single    Spouse Name: N/A    Number of Children: N/A  . Years of Education: N/A   Occupational History  . Not on file.   Social History Main Topics  . Smoking status: Never Smoker   . Smokeless tobacco: Never Used  . Alcohol Use: No  . Drug Use: No  . Sexual Activity: Not on file   Other Topics Concern  . Not on file   Social History Narrative  . No narrative on file    Allergies  Allergen Reactions  . Penicillins Hives     Constitutional: Positive headache, fatigue and fever. Denies  abrupt weight changes.  HEENT:  Positive nasal congestion, sore throat. Denies eye redness, eye pain, pressure behind the eyes, facial pain, ear pain, ringing in the ears, wax buildup, runny nose or bloody nose. Respiratory: Positive cough. Denies difficulty breathing or shortness of breath.  Cardiovascular: Denies chest pain, chest tightness, palpitations or swelling in the hands or feet.   No other specific complaints in a complete review of systems (except as listed in HPI above).  Objective:   There were no  vitals taken for this visit. Wt Readings from Last 3 Encounters:  11/25/13 162 lb (73.483 kg)  12/28/12 159 lb (72.122 kg)  02/18/12 158 lb (71.668 kg)     General: Appears her stated age, well developed, well nourished in NAD. HEENT: Head: normal shape and size, frontal sinus tenderness noted; Eyes: sclera white, no icterus, conjunctiva pink, PERRLA and EOMs intact; Ears: Tm's gray and intact, normal light reflex; Nose: mucosa pink and moist, septum midline; Throat/Mouth: + PND. Teeth present, mucosa erythematous and moist, no exudate noted, no lesions or ulcerations noted.  Neck: Mild cervical lymphadenopathy. Neck supple, trachea midline. No massses, lumps or thyromegaly present.  Cardiovascular: Normal rate and rhythm. S1,S2 noted.  No murmur, rubs or gallops noted. No JVD or BLE edema. No carotid bruits noted. Pulmonary/Chest: Normal effort and positive vesicular breath sounds. No respiratory distress. No wheezes, rales or ronchi noted.      Assessment & Plan:   Acute sinusitis:  Get some rest and drink plenty of water Do salt water gargles for the sore throat eRx for Ceftin BID x 10 days  RTC as needed or if symptoms persist.

## 2014-01-18 NOTE — Progress Notes (Signed)
Pre visit review using our clinic review tool, if applicable. No additional management support is needed unless otherwise documented below in the visit note. 

## 2014-01-18 NOTE — Patient Instructions (Addendum)

## 2014-03-07 ENCOUNTER — Other Ambulatory Visit: Payer: Self-pay | Admitting: *Deleted

## 2014-03-07 MED ORDER — MONTELUKAST SODIUM 10 MG PO TABS: ORAL_TABLET | ORAL | Status: DC

## 2014-03-07 NOTE — Telephone Encounter (Signed)
Lm on pts vm requesting a call back. OV required for additional refills

## 2014-03-07 NOTE — Telephone Encounter (Signed)
Spoke to pt and sched CPE, Rx sent to requested pharmacy

## 2014-04-05 ENCOUNTER — Ambulatory Visit (INDEPENDENT_AMBULATORY_CARE_PROVIDER_SITE_OTHER): Payer: 59 | Admitting: Family Medicine

## 2014-04-05 ENCOUNTER — Encounter: Payer: Self-pay | Admitting: Family Medicine

## 2014-04-05 VITALS — BP 106/70 | HR 80 | Temp 97.6°F | Ht 66.0 in | Wt 155.5 lb

## 2014-04-05 DIAGNOSIS — M25539 Pain in unspecified wrist: Secondary | ICD-10-CM

## 2014-04-05 DIAGNOSIS — Z136 Encounter for screening for cardiovascular disorders: Secondary | ICD-10-CM

## 2014-04-05 DIAGNOSIS — M25532 Pain in left wrist: Secondary | ICD-10-CM | POA: Insufficient documentation

## 2014-04-05 DIAGNOSIS — D259 Leiomyoma of uterus, unspecified: Secondary | ICD-10-CM

## 2014-04-05 DIAGNOSIS — E559 Vitamin D deficiency, unspecified: Secondary | ICD-10-CM

## 2014-04-05 DIAGNOSIS — Z01419 Encounter for gynecological examination (general) (routine) without abnormal findings: Secondary | ICD-10-CM

## 2014-04-05 DIAGNOSIS — D509 Iron deficiency anemia, unspecified: Secondary | ICD-10-CM

## 2014-04-05 DIAGNOSIS — F5089 Other specified eating disorder: Secondary | ICD-10-CM

## 2014-04-05 DIAGNOSIS — Z0000 Encounter for general adult medical examination without abnormal findings: Secondary | ICD-10-CM

## 2014-04-05 LAB — CBC WITH DIFFERENTIAL/PLATELET
BASOS PCT: 0.6 % (ref 0.0–3.0)
Basophils Absolute: 0 10*3/uL (ref 0.0–0.1)
EOS ABS: 0.1 10*3/uL (ref 0.0–0.7)
Eosinophils Relative: 1.5 % (ref 0.0–5.0)
HCT: 35 % — ABNORMAL LOW (ref 36.0–46.0)
Hemoglobin: 11 g/dL — ABNORMAL LOW (ref 12.0–15.0)
Lymphocytes Relative: 27.9 % (ref 12.0–46.0)
Lymphs Abs: 2.1 10*3/uL (ref 0.7–4.0)
MCHC: 31.5 g/dL (ref 30.0–36.0)
MONO ABS: 0.5 10*3/uL (ref 0.1–1.0)
Monocytes Relative: 6.2 % (ref 3.0–12.0)
NEUTROS PCT: 63.8 % (ref 43.0–77.0)
Neutro Abs: 4.8 10*3/uL (ref 1.4–7.7)
PLATELETS: 367 10*3/uL (ref 150.0–400.0)
RBC: 4.76 Mil/uL (ref 3.87–5.11)
RDW: 15.9 % — ABNORMAL HIGH (ref 11.5–15.5)
WBC: 7.5 10*3/uL (ref 4.0–10.5)

## 2014-04-05 LAB — VITAMIN D 25 HYDROXY (VIT D DEFICIENCY, FRACTURES): VITD: 37.98 ng/mL (ref 30.00–100.00)

## 2014-04-05 LAB — COMPREHENSIVE METABOLIC PANEL
ALBUMIN: 3.8 g/dL (ref 3.5–5.2)
ALK PHOS: 50 U/L (ref 39–117)
ALT: 16 U/L (ref 0–35)
AST: 23 U/L (ref 0–37)
BILIRUBIN TOTAL: 0.4 mg/dL (ref 0.2–1.2)
BUN: 13 mg/dL (ref 6–23)
CO2: 27 mEq/L (ref 19–32)
Calcium: 9.2 mg/dL (ref 8.4–10.5)
Chloride: 106 mEq/L (ref 96–112)
Creatinine, Ser: 1 mg/dL (ref 0.4–1.2)
GFR: 86.46 mL/min (ref >60.00)
Glucose, Bld: 83 mg/dL (ref 70–99)
POTASSIUM: 4.3 meq/L (ref 3.5–5.1)
Sodium: 138 mEq/L (ref 135–145)
Total Protein: 7.2 g/dL (ref 6.0–8.3)

## 2014-04-05 LAB — LIPID PANEL
Cholesterol: 152 mg/dL (ref 0–200)
HDL: 52.8 mg/dL (ref >39.00)
LDL CALC: 89 mg/dL (ref 0–99)
NonHDL: 99.2
TRIGLYCERIDES: 53 mg/dL (ref 0.0–149.0)
Total CHOL/HDL Ratio: 3
VLDL: 10.6 mg/dL (ref 0.0–40.0)

## 2014-04-05 LAB — TSH: TSH: 0.7 u[IU]/mL (ref 0.35–4.50)

## 2014-04-05 LAB — SEDIMENTATION RATE: Sed Rate: 29 mm/hr — ABNORMAL HIGH (ref 0–22)

## 2014-04-05 LAB — RHEUMATOID FACTOR: Rhuematoid fact SerPl-aCnc: <10 IU/mL (ref <=14)

## 2014-04-05 LAB — FERRITIN: FERRITIN: 5.7 ng/mL — AB (ref 10.0–291.0)

## 2014-04-05 LAB — C-REACTIVE PROTEIN: CRP: <0.5 mg/dL (ref 0.5–20.0)

## 2014-04-05 MED ORDER — LEVALBUTEROL HCL 1.25 MG/3ML IN NEBU
1.2500 mg | INHALATION_SOLUTION | RESPIRATORY_TRACT | Status: DC | PRN
Start: 2014-04-05 — End: 2014-04-07

## 2014-04-05 MED ORDER — MONTELUKAST SODIUM 10 MG PO TABS: ORAL_TABLET | ORAL | Status: DC

## 2014-04-05 MED ORDER — LEVALBUTEROL TARTRATE 45 MCG/ACT IN AERO: 1.0000 | INHALATION_SPRAY | RESPIRATORY_TRACT | Status: DC | PRN

## 2014-04-05 MED ORDER — TRIAMCINOLONE ACETONIDE 0.1 % EX CREA: 1.0000 "application " | TOPICAL_CREAM | Freq: Two times a day (BID) | CUTANEOUS | Status: DC

## 2014-04-05 NOTE — Assessment & Plan Note (Signed)
Reviewed preventive care protocols, scheduled due services, and updated immunizations Discussed nutrition, exercise, diet, and healthy lifestyle.  She deferred pap(and pelvic exam)_ today since she is on her period.  She is still well within normal screening range.

## 2014-04-05 NOTE — Progress Notes (Addendum)
Subjective:    Patient ID: Mackenzie Shea, female    DOB: 1980-11-21, 33 y.o.   MRN: [id number]  HPI  33 yo pleasant female here for CPX.  G0, virginal.  First pap smear was done by me on 10/02/2011- normal.  No dysuria or vaginal discharge.  Pelvic Ultrasound ordered by me in 11/2012 due to heavy menstrual bleeding showed numerous fibroids, including two large ones.  Referred her to GYN for further evaluation and tx.  Saw Dr. Helane Rima in 04/2013- note reviewed.  Since pt was not desiring fertility at that time, she recommended observation with follow up pelvic ultrasounds.  She has not had heavy bleeding and is not desiring fertility.   Has noticed some "PICA"- craving ice chips and baking soda.  Usually not dizzy when she stands from seated position. Lab Results  Component Value Date   CHOL 159 12/28/2012   HDL 59.20 12/28/2012   LDLCALC 90 12/28/2012   TRIG 51.0 12/28/2012   CHOLHDL 3 12/28/2012    Vit D deficiency- was previously taking 2000 IU daily.  Has not been taking it in awhile.  Left wrist pain- has noticed it for past several months.  Feels stiff in the morning.  No tingling in fingers or decreased grip strength.  No redness or warmth in digits.  Does have FH of OA and RA.  Asthma- has been stable.  On Veramyst and Singulair with as needed xopenex.  Doing well- spring allergens typically her trigger but this year, symptoms controlled.  Patient Active Problem List   Diagnosis Date Noted  . Encounter for routine gynecological examination 04/05/2014  . Left wrist pain 04/05/2014  . Uterine fibroid 04/05/2014  . Asthma, chronic 12/28/2012  . Irregular menstrual cycle 12/28/2012  . Vitamin D deficiency 10/02/2011  . Routine general medical examination at a health care facility 10/02/2011  . Tachycardia 06/27/2011   Past Medical History  Diagnosis Date  . Personal history of traumatic fracture   . Esophageal reflux   . Extrinsic asthma, unspecified   . Unspecified vitamin D  deficiency   . Allergic rhinitis, cause unspecified   . Leukocytosis, unspecified   . Elevated blood pressure reading without diagnosis of hypertension   . Pain in joint, shoulder region    No past surgical history on file. History  Substance Use Topics  . Smoking status: Never Smoker   . Smokeless tobacco: Never Used  . Alcohol Use: No   No family history on file. Allergies  Allergen Reactions  . Penicillins Hives   Current Outpatient Prescriptions on File Prior to Visit  Medication Sig Dispense Refill  . cefUROXime (CEFTIN) 500 MG tablet Take 1 tablet (500 mg total) by mouth 2 (two) times daily with a meal.  20 tablet  0  . cetirizine (ZYRTEC) 10 MG tablet Take 10 mg by mouth at bedtime.      . Cholecalciferol (VITAMIN D3) 2000 UNITS TABS Take 1 tablet by mouth daily.      . cyclobenzaprine (FLEXERIL) 10 MG tablet Take 10 mg by mouth 3 (three) times daily as needed.        . fluticasone (VERAMYST) 27.5 MCG/SPRAY nasal spray Place 2 sprays into the nose daily.        . Fluticasone-Salmeterol (ADVAIR DISKUS) 100-50 MCG/DOSE AEPB Inhale 1 puff into the lungs 2 (two) times daily.      Marland Kitchen LORazepam (ATIVAN) 0.5 MG tablet Take 1 tablet (0.5 mg total) by mouth daily as needed.  30 tablet  1  . meclizine (ANTIVERT) 25 MG tablet Take 1 tablet (25 mg total) by mouth 3 (three) times daily as needed.  30 tablet  6  . omeprazole (PRILOSEC) 20 MG capsule TAKE ONE CAPSULE BY MOUTH EVERY DAY  30 capsule  5   No current facility-administered medications on file prior to visit.   The PMH, PSH, Social History, Family History, Medications, and allergies have been reviewed in [facility], and have been updated if relevant.   Review of Systems    See HPI Patient reports no  vision/ hearing changes,anorexia, weight change, fever ,adenopathy, persistant / recurrent hoarseness, swallowing issues, chest pain, edema,persistant / recurrent cough, hemoptysis, dyspnea(rest, exertional, paroxysmal nocturnal),  gastrointestinal  bleeding (melena, rectal bleeding), abdominal pain, excessive heart burn, GU symptoms(dysuria, hematuria, pyuria, voiding/incontinence  Issues) syncope, focal weakness, severe memory loss, concerning skin lesions, depression, anxiety, abnormal bruising/bleeding Objective:   Physical Exam BP 106/70  Pulse 80  Temp(Src) 97.6 F (36.4 C) (Oral)  Ht 5\' 6"  (1.676 m)  Wt 155 lb 8 oz (70.534 kg)  BMI 25.11 kg/m2  SpO2 98%  LMP 03/30/2014   General:  Well-developed,well-nourished,in no acute distress; alert,appropriate and cooperative throughout examination Head:  normocephalic and atraumatic.   Eyes:  vision grossly intact, pupils equal, pupils round, and pupils reactive to light.   Ears:  R ear normal and L ear normal.   Nose:  no external deformity.   Mouth:  good dentition.   Neck:  No deformities, masses, or tenderness noted. Breasts:  No mass, nodules, thickening, tenderness, bulging, retraction, inflamation, nipple discharge or skin changes noted.   Lungs:  Normal respiratory effort, chest expands symmetrically. Lungs are clear to auscultation, no crackles or wheezes. Heart:  Normal rate and regular rhythm. S1 and S2 normal without gallop, murmur, click, rub or other extra sounds. Abdomen:  Bowel sounds positive,abdomen firm and enlarged- per pt, will decrease in size and become soft after her period is done. Msk:  No deformity or scoliosis noted of thoracic or lumbar spine. +mild prominence of medial wrist, some swelling of right 1st dip otherwise unremarkable   Extremities:  No clubbing, cyanosis, edema, or deformity noted with normal full range of motion of all joints.   Neurologic:  alert & oriented X3 and gait normal.   Skin:  Intact without suspicious lesions or rashes Cervical Nodes:  No lymphadenopathy noted Axillary Nodes:  No palpable lymphadenopathy Psych:  Cognition and judgment appear intact. Alert and cooperative with normal attention span and  concentration. No apparent delusions, illusions, hallucinations

## 2014-04-05 NOTE — Patient Instructions (Signed)
Good to see you. I will call you with your lab results.  Take tylenol 500mg  1-2 tabs three times a day for pain. Aleve 1-2 tabs twice a day with food Glucosamine sulfate 750mg  twice a day is a supplement that may help.   Heat or ice 15 minutes at a time 3-4 times a day as needed to help with pain.

## 2014-04-05 NOTE — Addendum Note (Signed)
Addended by: Lucille Passy on: 04/05/2014 08:37 AM   Modules accepted: Orders

## 2014-04-05 NOTE — Assessment & Plan Note (Addendum)
Deteriorated.  Feels much larger on exam today. Asymptomatic and not desiring fertility at this point.  Continue to monitor with GYN.

## 2014-04-05 NOTE — Assessment & Plan Note (Signed)
Check CBC, ferritin for further evaluation.

## 2014-04-05 NOTE — Assessment & Plan Note (Signed)
Recheck Vit D level today.

## 2014-04-05 NOTE — Assessment & Plan Note (Signed)
Probable OA but will check lab work today to rule out RA. Advised prn tylenol.

## 2014-04-06 ENCOUNTER — Other Ambulatory Visit: Payer: Self-pay | Admitting: Family Medicine

## 2014-04-06 DIAGNOSIS — D509 Iron deficiency anemia, unspecified: Secondary | ICD-10-CM | POA: Insufficient documentation

## 2014-04-06 MED ORDER — FERROUS SULFATE 325 (65 FE) MG PO TABS: 325.0000 mg | ORAL_TABLET | Freq: Two times a day (BID) | ORAL | Status: DC

## 2014-04-06 NOTE — Addendum Note (Signed)
Addended by: Modena Nunnery on: 04/06/2014 03:40 PM   Modules accepted: Orders

## 2014-04-07 ENCOUNTER — Telehealth: Payer: Self-pay | Admitting: *Deleted

## 2014-04-07 MED ORDER — ALBUTEROL SULFATE (2.5 MG/3ML) 0.083% IN NEBU: 2.5000 mg | INHALATION_SOLUTION | Freq: Four times a day (QID) | RESPIRATORY_TRACT | Status: DC | PRN

## 2014-04-07 NOTE — Telephone Encounter (Signed)
Rx sent to requested pharmacy

## 2014-04-07 NOTE — Telephone Encounter (Signed)
Fax from [facility] not covered but Ventolin is. OK to change?

## 2014-04-07 NOTE — Telephone Encounter (Signed)
Ok to change

## 2014-06-06 ENCOUNTER — Ambulatory Visit (INDEPENDENT_AMBULATORY_CARE_PROVIDER_SITE_OTHER): Payer: BC Managed Care – PPO | Admitting: Internal Medicine

## 2014-06-06 ENCOUNTER — Encounter: Payer: Self-pay | Admitting: Internal Medicine

## 2014-06-06 VITALS — BP 118/78 | HR 86 | Temp 98.1°F | Wt 154.5 lb

## 2014-06-06 DIAGNOSIS — J329 Chronic sinusitis, unspecified: Secondary | ICD-10-CM

## 2014-06-06 DIAGNOSIS — J0110 Acute frontal sinusitis, unspecified: Secondary | ICD-10-CM

## 2014-06-06 DIAGNOSIS — B9689 Other specified bacterial agents as the cause of diseases classified elsewhere: Secondary | ICD-10-CM

## 2014-06-06 DIAGNOSIS — A499 Bacterial infection, unspecified: Secondary | ICD-10-CM

## 2014-06-06 MED ORDER — CEFUROXIME AXETIL 500 MG PO TABS: 500.0000 mg | ORAL_TABLET | Freq: Two times a day (BID) | ORAL | Status: DC

## 2014-06-06 NOTE — Progress Notes (Signed)
HPI  Pt presents to the clinic today with c/o headache, facial pain and pressure, nasal congestion, sore throat and cough. She reports this started 10 days ago. She is blowing dark yellow mucous out of her nose. She reports that she has been running low grade fevers up to 100.0. She denies chills or body aches. She does have a history of allergies and asthma. She has been taking singulair, nasal spray and zyrtec without much relief. She also takes her advair and albuterol. She does not smoke. She is unsure if she has had sick contacts or not.  Review of Systems    Past Medical History  Diagnosis Date  . Personal history of traumatic fracture   . Esophageal reflux   . Extrinsic asthma, unspecified   . Unspecified vitamin D deficiency   . Allergic rhinitis, cause unspecified   . Leukocytosis, unspecified   . Elevated blood pressure reading without diagnosis of hypertension   . Pain in joint, shoulder region     No family history on file.  History   Social History  . Marital Status: Single    Spouse Name: N/A    Number of Children: N/A  . Years of Education: N/A   Occupational History  . Not on file.   Social History Main Topics  . Smoking status: Never Smoker   . Smokeless tobacco: Never Used  . Alcohol Use: No  . Drug Use: No  . Sexual Activity: Not on file   Other Topics Concern  . Not on file   Social History Narrative  . No narrative on file    Allergies  Allergen Reactions  . Penicillins Hives     Constitutional: Positive headache, fatigue and fever. Denies abrupt weight changes.  HEENT:  Positive facial pain, nasal congestion and sore throat. Denies eye redness, ear pain, ringing in the ears, wax buildup, runny nose or bloody nose. Respiratory: Positive cough. Denies difficulty breathing or shortness of breath.  Cardiovascular: Denies chest pain, chest tightness, palpitations or swelling in the hands or feet.   No other specific complaints in a complete  review of systems (except as listed in HPI above).  Objective:  BP 118/78  Pulse 86  Temp(Src) 98.1 F (36.7 C) (Oral)  Wt 154 lb 8 oz (70.081 kg)  SpO2 98%   General: Appears her stated age, well developed, well nourished in NAD. HEENT: Head: normal shape and size, frontal sinus tenderness noted; Eyes: sclera white, no icterus, conjunctiva pink, PERRLA and EOMs intact; Ears: Tm's gray and intact, normal light reflex; Nose: mucosa pink and moist, septum midline; Throat/Mouth: + PND. Teeth present, mucosa pink and moist, exudate noted on bilateral tonsillar pillars, no lesions or ulcerations noted.  Neck: Neck supple, trachea midline. No massses, lumps or thyromegaly present.  Cardiovascular: Tachycardic with normal rhythm. S1,S2 noted.  No murmur, rubs or gallops noted. No JVD or BLE edema. No carotid bruits noted. Pulmonary/Chest: Normal effort and positive vesicular breath sounds. No respiratory distress. No wheezes, rales or ronchi noted.      Assessment & Plan:   Acute bacterial sinusitis  Can use a Neti Pot which can be purchased from your local drug store. Continue your allergy and asthma medications as directed Ibuprofen for fever/body aches Ceftin BID for 10 days  RTC as needed or if symptoms persist.

## 2014-06-06 NOTE — Patient Instructions (Addendum)

## 2014-06-06 NOTE — Progress Notes (Signed)
Pre visit review using our clinic review tool, if applicable. No additional management support is needed unless otherwise documented below in the visit note. 

## 2014-06-08 ENCOUNTER — Other Ambulatory Visit: Payer: 59

## 2015-06-22 ENCOUNTER — Encounter: Payer: Self-pay | Admitting: Internal Medicine

## 2015-06-22 ENCOUNTER — Ambulatory Visit (INDEPENDENT_AMBULATORY_CARE_PROVIDER_SITE_OTHER): Payer: BLUE CROSS/BLUE SHIELD | Admitting: Internal Medicine

## 2015-06-22 VITALS — BP 124/76 | HR 97 | Temp 98.1°F | Wt 160.0 lb

## 2015-06-22 DIAGNOSIS — J029 Acute pharyngitis, unspecified: Secondary | ICD-10-CM

## 2015-06-22 DIAGNOSIS — R0982 Postnasal drip: Secondary | ICD-10-CM

## 2015-06-22 MED ORDER — MONTELUKAST SODIUM 10 MG PO TABS: ORAL_TABLET | ORAL | Status: DC

## 2015-06-22 MED ORDER — PREDNISONE 10 MG PO TABS: ORAL_TABLET | ORAL | Status: DC

## 2015-06-22 NOTE — Patient Instructions (Signed)

## 2015-06-22 NOTE — Addendum Note (Signed)
Addended by: Lurlean Nanny on: 06/22/2015 04:28 PM   Modules accepted: Orders

## 2015-06-22 NOTE — Progress Notes (Signed)
HPI  Pt presents to the clinic today with c/o sore throat and post nasal drip. This has been intermittent over the last 2 weeks. She has had some difficulty swallowing. She intermittently has had nasal congestion, chills and body aches but she denies fever. She has tried Ibuprofen, gargling with salt water, drinking water with honey, lemon and tumeric without much relief. She does have a history of allergies and asthma. She takes Singulair daily and Zyrtec intermittently. She has not had to use her inhaler. She has had sick contacts that she is aware of.  Review of Systems    Past Medical History  Diagnosis Date  . Personal history of traumatic fracture   . Esophageal reflux   . Extrinsic asthma, unspecified   . Unspecified vitamin D deficiency   . Allergic rhinitis, cause unspecified   . Leukocytosis, unspecified   . Elevated blood pressure reading without diagnosis of hypertension   . Pain in joint, shoulder region     History reviewed. No pertinent family history.  Social History   Social History  . Marital Status: Single    Spouse Name: N/A  . Number of Children: N/A  . Years of Education: N/A   Occupational History  . Not on file.   Social History Main Topics  . Smoking status: Never Smoker   . Smokeless tobacco: Never Used  . Alcohol Use: No  . Drug Use: No  . Sexual Activity: Not on file   Other Topics Concern  . Not on file   Social History Narrative    Allergies  Allergen Reactions  . Penicillins Hives     Constitutional: Positive fatigue. Denies headache, fever or abrupt weight changes.  HEENT:  Positive nasal congestion and sore throat. Denies eye redness, ear pain, ringing in the ears, wax buildup, runny nose or bloody nose. Respiratory:  Denies cough, difficulty breathing or shortness of breath.  Cardiovascular: Denies chest pain, chest tightness, palpitations or swelling in the hands or feet.   No other specific complaints in a complete review of  systems (except as listed in HPI above).  Objective:  BP 124/76 mmHg  Pulse 97  Temp(Src) 98.1 F (36.7 C) (Oral)  Wt 160 lb (72.576 kg)  SpO2 98%  LMP 05/30/2015   General: Appears her stated age, well developed, well nourished in NAD. HEENT: Head: normal shape and size, no sinus tenderness noted; Eyes: sclera white, no icterus, conjunctiva pink; Ears: Tm's gray and intact, normal light reflex; Nose: mucosa pink and moist, septum midline; Throat/Mouth: + PND. Teeth present, mucosa erythematous and moist, tonsils note enlarged, no exudate noted, no lesions or ulcerations noted.  Neck:  No cervical adenopathy noted.  Cardiovascular: Normal rate and rhythm. S1,S2 noted.  No murmur, rubs or gallops noted.  Pulmonary/Chest: Normal effort and positive vesicular breath sounds. No respiratory distress. No wheezes, rales or ronchi noted.      Assessment & Plan:   Sore throat secondary to post nasal drip:  RST negative Will send throat culture (she reports history of frequent strep) Continue salt water gargles Start taking Zyrtec daily eRx for Pred Taper- advised no Ibuprofen while on Prednisone  RTC as needed or if symptoms persist.

## 2015-06-22 NOTE — Progress Notes (Signed)
Pre visit review using our clinic review tool, if applicable. No additional management support is needed unless otherwise documented below in the visit note. 

## 2015-06-24 LAB — CULTURE, GROUP A STREP: ORGANISM ID, BACTERIA: NORMAL

## 2015-07-18 ENCOUNTER — Other Ambulatory Visit: Payer: Self-pay | Admitting: Internal Medicine

## 2015-08-06 ENCOUNTER — Encounter: Payer: Self-pay | Admitting: Family Medicine

## 2015-08-06 ENCOUNTER — Other Ambulatory Visit (HOSPITAL_COMMUNITY)
Admission: RE | Admit: 2015-08-06 | Discharge: 2015-08-06 | Disposition: A | Discharge status: Home / Self Care | Payer: BLUE CROSS/BLUE SHIELD | Source: Ambulatory Visit | Attending: Family Medicine | Admitting: Family Medicine

## 2015-08-06 ENCOUNTER — Ambulatory Visit (INDEPENDENT_AMBULATORY_CARE_PROVIDER_SITE_OTHER): Payer: BLUE CROSS/BLUE SHIELD | Admitting: Family Medicine

## 2015-08-06 VITALS — BP 116/72 | HR 111 | Temp 98.0°F | Ht 66.0 in | Wt 160.0 lb

## 2015-08-06 DIAGNOSIS — Z1151 Encounter for screening for human papillomavirus (HPV): Secondary | ICD-10-CM | POA: Insufficient documentation

## 2015-08-06 DIAGNOSIS — E559 Vitamin D deficiency, unspecified: Secondary | ICD-10-CM | POA: Diagnosis not present

## 2015-08-06 DIAGNOSIS — Z0000 Encounter for general adult medical examination without abnormal findings: Secondary | ICD-10-CM

## 2015-08-06 DIAGNOSIS — Z01419 Encounter for gynecological examination (general) (routine) without abnormal findings: Secondary | ICD-10-CM | POA: Insufficient documentation

## 2015-08-06 DIAGNOSIS — D259 Leiomyoma of uterus, unspecified: Secondary | ICD-10-CM

## 2015-08-06 DIAGNOSIS — J4520 Mild intermittent asthma, uncomplicated: Secondary | ICD-10-CM

## 2015-08-06 DIAGNOSIS — D509 Iron deficiency anemia, unspecified: Secondary | ICD-10-CM

## 2015-08-06 LAB — COMPREHENSIVE METABOLIC PANEL
ALK PHOS: 49 U/L (ref 39–117)
ALT: 14 U/L (ref 0–35)
AST: 19 U/L (ref 0–37)
Albumin: 3.7 g/dL (ref 3.5–5.2)
BUN: 8 mg/dL (ref 6–23)
CO2: 26 mEq/L (ref 19–32)
Calcium: 9 mg/dL (ref 8.4–10.5)
Chloride: 106 mEq/L (ref 96–112)
Creatinine, Ser: 0.93 mg/dL (ref 0.40–1.20)
GFR: 87.92 mL/min (ref >60.00)
GLUCOSE: 78 mg/dL (ref 70–99)
POTASSIUM: 3.8 meq/L (ref 3.5–5.1)
Sodium: 138 mEq/L (ref 135–145)
TOTAL PROTEIN: 6.9 g/dL (ref 6.0–8.3)
Total Bilirubin: 0.3 mg/dL (ref 0.2–1.2)

## 2015-08-06 LAB — CBC WITH DIFFERENTIAL/PLATELET
BASOS PCT: 0.3 % (ref 0.0–3.0)
Basophils Absolute: 0 10*3/uL (ref 0.0–0.1)
EOS PCT: 1.7 % (ref 0.0–5.0)
Eosinophils Absolute: 0.1 10*3/uL (ref 0.0–0.7)
HCT: 36.8 % (ref 36.0–46.0)
Hemoglobin: 12.1 g/dL (ref 12.0–15.0)
LYMPHS ABS: 2.3 10*3/uL (ref 0.7–4.0)
Lymphocytes Relative: 26.6 % (ref 12.0–46.0)
MCHC: 33 g/dL (ref 30.0–36.0)
MCV: 79.4 fl (ref 78.0–100.0)
MONOS PCT: 7.7 % (ref 3.0–12.0)
Monocytes Absolute: 0.7 10*3/uL (ref 0.1–1.0)
NEUTROS ABS: 5.4 10*3/uL (ref 1.4–7.7)
NEUTROS PCT: 63.7 % (ref 43.0–77.0)
Platelets: 292 10*3/uL (ref 150.0–400.0)
RBC: 4.64 Mil/uL (ref 3.87–5.11)
RDW: 20.6 % — AB (ref 11.5–15.5)
WBC: 8.5 10*3/uL (ref 4.0–10.5)

## 2015-08-06 LAB — LIPID PANEL
CHOLESTEROL: 130 mg/dL (ref 0–200)
HDL: 41.7 mg/dL (ref >39.00)
LDL Cholesterol: 75 mg/dL (ref 0–99)
NONHDL: 88.64
Total CHOL/HDL Ratio: 3
Triglycerides: 68 mg/dL (ref 0.0–149.0)
VLDL: 13.6 mg/dL (ref 0.0–40.0)

## 2015-08-06 LAB — TSH: TSH: 0.96 u[IU]/mL (ref 0.35–4.50)

## 2015-08-06 LAB — VITAMIN B12: Vitamin B-12: 651 pg/mL (ref 211–911)

## 2015-08-06 LAB — VITAMIN D 25 HYDROXY (VIT D DEFICIENCY, FRACTURES): VITD: 27.37 ng/mL — ABNORMAL LOW (ref 30.00–100.00)

## 2015-08-06 NOTE — Progress Notes (Signed)
Pre visit review using our clinic review tool, if applicable. No additional management support is needed unless otherwise documented below in the visit note. 

## 2015-08-06 NOTE — Patient Instructions (Signed)
Great to see you. Happy Holidays. We will call you with your results from today and you can view them online.

## 2015-08-06 NOTE — Assessment & Plan Note (Signed)
Reviewed preventive care protocols, scheduled due services, and updated immunizations Discussed nutrition, exercise, diet, and healthy lifestyle.  Influenza vaccine declined.  Labs today.  Pap smear done today.  Orders Placed This Encounter  Procedures  . CBC with Differential/Platelet  . Comprehensive metabolic panel  . Lipid panel  . TSH  . Vitamin D, 25-hydroxy  . Vitamin B12

## 2015-08-06 NOTE — Assessment & Plan Note (Signed)
Recheck CBC today. 

## 2015-08-06 NOTE — Progress Notes (Signed)
Subjective:    Patient ID: Mackenzie Shea, female    DOB: 1981/01/24, 34 y.o.   MRN: [id number]  HPI  34 yo pleasant female here for CPX.  G0, virginal.  First pap smear was done by me on 10/02/2011- normal.  No dysuria or vaginal discharge.  Pelvic Ultrasound ordered by me in 11/2012 due to heavy menstrual bleeding showed numerous fibroids, including two large ones.  Referred her to GYN for further evaluation and tx,  Dr. Helane Rima in 04/2013- note reviewed.  Since pt was not desiring fertility at that time, she recommended observation with follow up pelvic ultrasounds.  She has not had heavy bleeding and is not desiring fertility.  Has 4 adopted and or fostered children.  Very happy at home.     Lab Results  Component Value Date   WBC 7.5 04/05/2014   HGB 11.0* 04/05/2014   HCT 35.0* 04/05/2014   MCV 73.6 Repeated and verified X2.* 04/05/2014   PLT 367.0 04/05/2014    Lab Results  Component Value Date   CHOL 152 04/05/2014   HDL 52.80 04/05/2014   LDLCALC 89 04/05/2014   TRIG 53.0 04/05/2014   CHOLHDL 3 04/05/2014    Vit D deficiency- was previously taking 2000 IU daily.  Has not been taking it in awhile.  Asthma- has been stable.  On Veramyst and Singulair with as needed xopenex.  Doing well- spring allergens typically her trigger but this year, symptoms controlled.  Patient Active Problem List   Diagnosis Date Noted  . Anemia, iron deficiency 04/06/2014  . Encounter for routine gynecological examination 04/05/2014  . Left wrist pain 04/05/2014  . Uterine fibroid 04/05/2014  . Pica in adults 04/05/2014  . Asthma, chronic 12/28/2012  . Irregular menstrual cycle 12/28/2012  . Vitamin D deficiency 10/02/2011  . Routine general medical examination at a health care facility 10/02/2011  . Tachycardia 06/27/2011   Past Medical History  Diagnosis Date  . Personal history of traumatic fracture   . Esophageal reflux   . Extrinsic asthma, unspecified   . Unspecified  vitamin D deficiency   . Allergic rhinitis, cause unspecified   . Leukocytosis, unspecified   . Elevated blood pressure reading without diagnosis of hypertension   . Pain in joint, shoulder region    No past surgical history on file. Social History  Substance Use Topics  . Smoking status: Never Smoker   . Smokeless tobacco: Never Used  . Alcohol Use: No   No family history on file. Allergies  Allergen Reactions  . Penicillins Hives   Current Outpatient Prescriptions on File Prior to Visit  Medication Sig Dispense Refill  . cetirizine (ZYRTEC) 10 MG tablet Take 10 mg by mouth at bedtime.    . ferrous sulfate 325 (65 FE) MG tablet Take 1 tablet (325 mg total) by mouth 2 (two) times daily with a meal. 60 tablet 3  . fluticasone (VERAMYST) 27.5 MCG/SPRAY nasal spray Place 2 sprays into the nose daily.      . Fluticasone-Salmeterol (ADVAIR DISKUS) 100-50 MCG/DOSE AEPB Inhale 1 puff into the lungs 2 (two) times daily.    Marland Kitchen levalbuterol (XOPENEX HFA) 45 MCG/ACT inhaler Inhale 1-2 puffs into the lungs every 4 (four) hours as needed. 1 Inhaler 1  . montelukast (SINGULAIR) 10 MG tablet Take 1 tablet (10 mg total) by mouth at bedtime. MUST SCHEDULE ANNUAL PHYSICAL FOR FURTHER REFILLS 30 tablet 0  . omeprazole (PRILOSEC) 20 MG capsule TAKE ONE CAPSULE BY MOUTH EVERY DAY 30  capsule 5   No current facility-administered medications on file prior to visit.   The PMH, PSH, Social History, Family History, Medications, and allergies have been reviewed in [facility], and have been updated if relevant.   Review of Systems    Review of Systems  Constitutional: Negative.   HENT: Negative.   Eyes: Negative.   Respiratory: Negative.   Cardiovascular: Negative.   Gastrointestinal: Negative.   Endocrine: Negative.   Genitourinary: Negative.   Musculoskeletal: Negative.   Skin: Negative.   Allergic/Immunologic: Negative.   Neurological: Negative.   Hematological: Negative.   Psychiatric/Behavioral:  Negative.   All other systems reviewed and are negative.   Objective:   Physical Exam BP 116/72 mmHg  Pulse 111  Temp(Src) 98 F (36.7 C) (Oral)  Ht 5\' 6"  (1.676 m)  Wt 160 lb (72.576 kg)  BMI 25.84 kg/m2  SpO2 98%  LMP 07/07/2015 (Approximate)    General:  Well-developed,well-nourished,in no acute distress; alert,appropriate and cooperative throughout examination Head:  normocephalic and atraumatic.   Eyes:  vision grossly intact, pupils equal, pupils round, and pupils reactive to light.   Ears:  R ear normal and L ear normal.   Nose:  no external deformity.   Mouth:  good dentition.   Neck:  No deformities, masses, or tenderness noted. Breasts:  No mass, nodules, thickening, tenderness, bulging, retraction, inflamation, nipple discharge or skin changes noted.   Lungs:  Normal respiratory effort, chest expands symmetrically. Lungs are clear to auscultation, no crackles or wheezes. Heart:  Normal rate and regular rhythm. S1 and S2 normal without gallop, murmur, click, rub or other extra sounds. Abdomen:  Bowel sounds positive,abdomen soft and non-tender without masses, organomegaly or hernias noted. Rectal:  no external abnormalities.   Genitalia:  Pelvic Exam:        External: normal female genitalia without lesions or masses        Vagina: normal without lesions or masses        Cervix: normal without lesions or masses        Adnexa: normal bimanual exam without masses or fullness        Uterus: firm and enlarged        Pap smear: performed Msk:  No deformity or scoliosis noted of thoracic or lumbar spine.   Extremities:  No clubbing, cyanosis, edema, or deformity noted with normal full range of motion of all joints.   Neurologic:  alert & oriented X3 and gait normal.   Skin:  Intact without suspicious lesions or rashes Cervical Nodes:  No lymphadenopathy noted Axillary Nodes:  No palpable lymphadenopathy Psych:  Cognition and judgment appear intact. Alert and cooperative  with normal attention span and concentration. No apparent delusions, illusions, hallucinations

## 2015-08-06 NOTE — Assessment & Plan Note (Signed)
Well controlled, no recent flares.

## 2015-08-07 LAB — CYTOLOGY - PAP

## 2015-09-24 ENCOUNTER — Other Ambulatory Visit: Payer: Self-pay | Admitting: *Deleted

## 2015-09-24 MED ORDER — MONTELUKAST SODIUM 10 MG PO TABS: 10.0000 mg | ORAL_TABLET | Freq: Every day | ORAL | Status: DC

## 2015-10-28 ENCOUNTER — Other Ambulatory Visit: Payer: Self-pay | Admitting: Family Medicine

## 2015-12-17 ENCOUNTER — Other Ambulatory Visit: Payer: Self-pay | Admitting: Family Medicine

## 2015-12-22 ENCOUNTER — Ambulatory Visit (INDEPENDENT_AMBULATORY_CARE_PROVIDER_SITE_OTHER): Payer: BLUE CROSS/BLUE SHIELD | Admitting: Internal Medicine

## 2015-12-22 VITALS — BP 126/84 | HR 105 | Temp 98.1°F | Resp 18 | Ht 66.0 in | Wt 165.0 lb

## 2015-12-22 DIAGNOSIS — R42 Dizziness and giddiness: Secondary | ICD-10-CM

## 2015-12-22 DIAGNOSIS — J301 Allergic rhinitis due to pollen: Secondary | ICD-10-CM

## 2015-12-22 MED ORDER — PREDNISONE 20 MG PO TABS: ORAL_TABLET | ORAL | Status: DC

## 2015-12-22 NOTE — Patient Instructions (Addendum)
     IF you received an x-ray today, you will receive an invoice from [facility]. Please contact [facility] at [phone] with questions or concerns regarding your invoice.   IF you received labwork today, you will receive an invoice from [employer]. Please contact [facility] at [phone] with questions or concerns regarding your invoice.   Our billing staff will not be able to assist you with questions regarding bills from these companies.  You will be contacted with the lab results as soon as they are available. The fastest way to get your results is to activate your My Chart account. Instructions are located on the last page of this paperwork. If you have not heard from [place] regarding the results in 2 weeks, please contact this office.     Add 12 hour Sudafed for 5 days Continue Zyrtec and Singulair Use inhaler when necessary Prednisone for 8 days Meclizine at bedtime

## 2015-12-22 NOTE — Progress Notes (Addendum)
Subjective:  By signing my name below, I, Mackenzie Shea, attest that this documentation has been prepared under the direction and in the presence of Tami Lin, MD.  Electronically Signed: Thea Alken, ED Scribe. 12/22/2015. 3:50 PM.   Patient ID: Mackenzie Shea, female    DOB: 1980/09/14, 35 y.o.   MRN: [id number]  HPI Chief Complaint  Patient presents with  . Dizziness    x 4-5 days, worst with head movements    HPI Comments: Mackenzie Shea is a 35 y.o. female who presents to the [facility] complaining of dizziness that began 4 days ago. Pt states dizziness suddenly started while moving/cleaning her house a few days ago and has been on and off since. She's noticed dizziness is triggered with head movements and with closing her eyes. She reports sore throat, cough, and hoarseness last week but this has improved. She still has some ear fullness. She reports allergies during this time of year. She currently takes zyrtec, sungulair and xopenex. Last neb treatment was last night.    Patient Active Problem List   Diagnosis Date Noted  . Anemia, iron deficiency 04/06/2014  . Encounter for routine gynecological examination 04/05/2014  . Left wrist pain 04/05/2014  . Uterine fibroid 04/05/2014  . Pica in adults 04/05/2014  . Asthma, chronic 12/28/2012  . Irregular menstrual cycle 12/28/2012  . Vitamin D deficiency 10/02/2011  . Routine general medical examination at a health care facility 10/02/2011  . Tachycardia 06/27/2011   Past Medical History  Diagnosis Date  . Personal history of traumatic fracture   . Esophageal reflux   . Extrinsic asthma, unspecified   . Unspecified vitamin D deficiency   . Allergic rhinitis, cause unspecified   . Leukocytosis, unspecified   . Elevated blood pressure reading without diagnosis of hypertension   . Pain in joint, shoulder region   . Allergy   . Anemia    History reviewed. No pertinent past surgical  history. Allergies  Allergen Reactions  . Penicillins Hives   Prior to Admission medications   Medication Sig Start Date End Date Taking? Authorizing Provider  cetirizine (ZYRTEC) 10 MG tablet Take 10 mg by mouth at bedtime.   Yes Historical Provider, MD  ferrous sulfate 325 (65 FE) MG tablet Take 1 tablet (325 mg total) by mouth 2 (two) times daily with a meal. 04/06/14  Yes Lucille Passy, MD  fluticasone (VERAMYST) 27.5 MCG/SPRAY nasal spray Place 2 sprays into the nose daily. Reported on 12/22/2015   Yes Historical Provider, MD  levalbuterol (XOPENEX HFA) 45 MCG/ACT inhaler INHALE ONE TO TWO PUFFS INTO LUNGS EVERY 4 HOURS AS NEEDED 12/18/15  Yes Lucille Passy, MD  levalbuterol Penne Lash) 0.63 MG/3ML nebulizer solution Take 0.63 mg by nebulization every 4 (four) hours as needed for wheezing or shortness of breath.   Yes Historical Provider, MD  montelukast (SINGULAIR) 10 MG tablet Take 1 tablet (10 mg total) by mouth at bedtime. 09/24/15  Yes Lucille Passy, MD  montelukast (SINGULAIR) 10 MG tablet TAKE 1 TABLET (10 MG TOTAL) BY MOUTH AT BEDTIME. MUST SCHEDULE ANNUAL PHYSICAL FOR FURTHER REFILLS 10/29/15  Yes Lucille Passy, MD  omeprazole (PRILOSEC) 20 MG capsule TAKE ONE CAPSULE BY MOUTH EVERY DAY 01/10/13  Yes Lucille Passy, MD  Fluticasone-Salmeterol (ADVAIR DISKUS) 100-50 MCG/DOSE AEPB Inhale 1 puff into the lungs 2 (two) times daily. Reported on 12/22/2015    Historical Provider, MD   Review of Systems  Constitutional:  Negative for fever and chills.  HENT: Positive for sore throat and voice change.   Respiratory: Positive for cough.   Neurological: Positive for dizziness.     Objective:   Physical Exam  Constitutional: She is oriented to person, place, and time. She appears well-developed and well-nourished. No distress.  HENT:  Head: Normocephalic and atraumatic.  Nose: Rhinorrhea ( clear) present.  Dull left TM Boggy turbinates   Eyes: Conjunctivae and EOM are normal. Pupils are equal,  round, and reactive to light.  Neck: Neck supple.  Cardiovascular: Normal rate.   Pulmonary/Chest: Effort normal. She has no wheezes. She has no rales.  Musculoskeletal: Normal range of motion.  Lymphadenopathy:    She has no cervical adenopathy.  Neurological: She is alert and oriented to person, place, and time. She has normal reflexes. She displays normal reflexes. No cranial nerve deficit. She exhibits normal muscle tone. Coordination normal.  Skin: Skin is warm and dry.  Psychiatric: She has a normal mood and affect. Her behavior is normal.  Nursing note and vitals reviewed.   Filed Vitals:   12/22/15 1452  BP: 126/84  Pulse: 105  Temp: 98.1 F (36.7 C)  TempSrc: Oral  Resp: 18  Height: 5\' 6"  (1.676 m)  Weight: 165 lb (74.844 kg)  SpO2: 99%    Assessment & Plan:  Dizzy 2nd to [facility] from [place]  Allergic rhinitis due to pollen    Meds ordered this encounter  Medications  . predniSONE (DELTASONE) 20 MG tablet    Sig: 4/4/3/3/2/2/1/1 single daily dose for 8 days    Dispense:  20 tablet    Refill:  0  continue other meds;singulair,advair,xopenex,zyrtec,veramyst  I have completed the patient encounter in its entirety as documented by the scribe, with editing by me where necessary. Marte Celani P. Laney Pastor, M.D.

## 2015-12-24 ENCOUNTER — Telehealth: Payer: Self-pay | Admitting: *Deleted

## 2015-12-24 NOTE — Telephone Encounter (Signed)
PLEASE NOTE: All timestamps contained within this report are represented as [place] Standard Time. CONFIDENTIALTY NOTICE: This fax transmission is intended only for the addressee. It contains information that is legally privileged, confidential or otherwise protected from use or disclosure. If you are not the intended recipient, you are strictly prohibited from reviewing, disclosing, copying using or disseminating any of this information or taking any action in reliance on or regarding this information. If you have received this fax in error, please notify us immediately by telephone so that we can arrange for its return to [place]. Phone: [phone], Toll-Free: [phone], Fax: [phone] Page: 1 of 2 Call Id: [id number] [place] Patient Name: Mackenzie Shea Shea Gender: Female DOB: [birth year] Age: 35 Y 11 M 29 D Return Phone Number: [phone] (Primary) Address: City/State/Zip: [place] [address] Client [place] Night - Client Client Site [place] Physician Deborra Medina, [place] Type Call Who Is Calling Patient / Member / Family / Caregiver Call Type Triage / Clinical Relationship To Patient Self Return Phone Number [phone] (Primary) Chief Complaint Dizziness Reason for Call Symptomatic / Request for [place] states has been having dizziness since Tuesday and has been getting worse every day PreDisposition Go to Urgent Care/Walk-In Clinic Translation No Nurse Assessment Nurse: Christel Mormon, RN, Levada Dy Date/Time (Eastern Time): 12/22/2015 10:25:09 AM Confirm and document reason for call. If symptomatic, describe symptoms. You must click the next button to save text entered. ---Caller states last week she started having allergy issues then Tuesday all at once she started getting dizzy and had to hold  onto the wall. She states she has been having episodes of dizziness since Tuesday and also having some on/off pressure in the left ear. Last week she had sinus congestion and sore throat. No headaches or pain. Has the patient traveled out of the country within the last 30 days? ---No Does the patient have any new or worsening symptoms? ---Yes Will a triage be completed? ---Yes Related visit to physician within the last 2 weeks? ---No Does the PT have any chronic conditions? (i.e. diabetes, asthma, etc.) ---Yes List chronic conditions. ---asthma/allergy induced Is the patient pregnant or possibly pregnant? (Ask all females between the ages of 8-55) ---No Is this a behavioral health or substance abuse call? ---No Guidelines Guideline Title Affirmed Question Affirmed Notes Nurse Date/Time (Eastern Time) Dizziness - Lightheadedness [1] MODERATE dizziness (e.g., interferes with normal activities) AND [2] has NOT been evaluated by physician for [place], [place], Levada Dy 12/22/2015 10:27:31 AM PLEASE NOTE: All timestamps contained within this report are represented as [place] Standard Time. CONFIDENTIALTY NOTICE: This fax transmission is intended only for the addressee. It contains information that is legally privileged, confidential or otherwise protected from use or disclosure. If you are not the intended recipient, you are strictly prohibited from reviewing, disclosing, copying using or disseminating any of this information or taking any action in reliance on or regarding this information. If you have received this fax in error, please notify us immediately by telephone so that we can arrange for its return to [place]. Phone: [phone], Toll-Free: [phone], Fax: [phone] Page: 2 of 2 Call Id: [id number] Guidelines Guideline Title Affirmed Question Affirmed Notes Nurse Date/Time Eilene Ghazi Time) this (Exception: dizziness caused by heat exposure, sudden standing, or poor fluid  intake) Disp. Time Eilene Ghazi Time) Disposition Final User 12/22/2015 10:34:27 AM See Physician within 24 Hours Yes [place],  RN, Marin Shutter Understands: Yes Disagree/Comply: Comply Care Advice Given Per Guideline SEE PHYSICIAN WITHIN 24 HOURS: FLUIDS: Drink several glasses of fruit juice, other clear fluids or water. This will improve hydration and blood glucose. If the weather is hot, make sure the fluids are cold. REST: Lie down with feet elevated for 1 hour. This will improve circulation and increase blood flow to the brain. CALL BACK IF: * Passes out (faints) * You become worse. CARE ADVICE given per Dizziness (Adult) guideline. Referrals [facility]

## 2015-12-24 NOTE — Telephone Encounter (Signed)
Patient was seen at [facility] 12/22/15.

## 2015-12-26 ENCOUNTER — Ambulatory Visit (INDEPENDENT_AMBULATORY_CARE_PROVIDER_SITE_OTHER): Payer: BLUE CROSS/BLUE SHIELD | Admitting: Internal Medicine

## 2015-12-26 ENCOUNTER — Encounter: Payer: Self-pay | Admitting: Internal Medicine

## 2015-12-26 ENCOUNTER — Telehealth: Payer: Self-pay

## 2015-12-26 VITALS — BP 122/82 | HR 88 | Temp 98.2°F | Wt 164.0 lb

## 2015-12-26 DIAGNOSIS — R42 Dizziness and giddiness: Secondary | ICD-10-CM | POA: Insufficient documentation

## 2015-12-26 NOTE — Progress Notes (Signed)
Subjective:    Patient ID: Mackenzie Shea, female    DOB: March 03, 1981, 35 y.o.   MRN: [id number]  HPI Here due to ongoing dizziness  Started all of the sudden 8 days ago Turned and had to hold onto the wall Seen at urgent care---given prednisone for serous otitis.  Also sudafed Made her feel weird--palpitations, nervous feeling, etc Still has the dizziness when closing eyes or moves head---but overall better (not as pronounced)  Allergies worse also  Current Outpatient Prescriptions on File Prior to Visit  Medication Sig Dispense Refill  . cetirizine (ZYRTEC) 10 MG tablet Take 10 mg by mouth at bedtime.    . ferrous sulfate 325 (65 FE) MG tablet Take 1 tablet (325 mg total) by mouth 2 (two) times daily with a meal. 60 tablet 3  . fluticasone (VERAMYST) 27.5 MCG/SPRAY nasal spray Place 2 sprays into the nose daily. Reported on 12/22/2015    . levalbuterol (XOPENEX HFA) 45 MCG/ACT inhaler INHALE ONE TO TWO PUFFS INTO LUNGS EVERY 4 HOURS AS NEEDED 15 g 2  . levalbuterol (XOPENEX) 0.63 MG/3ML nebulizer solution Take 0.63 mg by nebulization every 4 (four) hours as needed for wheezing or shortness of breath.    . montelukast (SINGULAIR) 10 MG tablet Take 1 tablet (10 mg total) by mouth at bedtime. 30 tablet 11  . omeprazole (PRILOSEC) 20 MG capsule TAKE ONE CAPSULE BY MOUTH EVERY DAY 30 capsule 5  . predniSONE (DELTASONE) 20 MG tablet 4/4/3/3/2/2/1/1 single daily dose for 8 days 20 tablet 0   No current facility-administered medications on file prior to visit.    Allergies  Allergen Reactions  . Penicillins Hives    Past Medical History  Diagnosis Date  . Personal history of traumatic fracture   . Esophageal reflux   . Extrinsic asthma, unspecified   . Unspecified vitamin D deficiency   . Allergic rhinitis, cause unspecified   . Leukocytosis, unspecified   . Elevated blood pressure reading without diagnosis of hypertension   . Pain in joint, shoulder region   . Allergy   .  Anemia     No past surgical history on file.  Family History  Problem Relation Age of Onset  . Heart disease Father     Social History   Social History  . Marital Status: Single    Spouse Name: N/A  . Number of Children: N/A  . Years of Education: N/A   Occupational History  . Not on file.   Social History Main Topics  . Smoking status: Never Smoker   . Smokeless tobacco: Never Used  . Alcohol Use: No  . Drug Use: No  . Sexual Activity: Not on file   Other Topics Concern  . Not on file   Social History Narrative   Review of Systems Chills and shivers  No tinnitus or hearing loss Some fullness in left ear No fever Doesn't really feel sick Headache yesterday-- wasn't there originally (and had some nausea like her migraine)    Objective:   Physical Exam  Constitutional: She is oriented to person, place, and time. She appears well-developed and well-nourished. No distress.  HENT:  Only slight view of left TM--appears normal Right TM obscured with cerumen  Eyes:  No nystagmus   Neck: Normal range of motion. Neck supple.  Lymphadenopathy:    She has no cervical adenopathy.  Neurological: She is alert and oriented to person, place, and time. She has normal strength. No cranial nerve deficit. She exhibits  normal muscle tone. She displays a negative Romberg sign. Coordination and gait normal.          Assessment & Plan:

## 2015-12-26 NOTE — Assessment & Plan Note (Signed)
Mild and seems to be vestibular No clear serous otitis now---but limited view ?related to her bad allergies Seems to have had side effects from the prednisone--this was the bad symptoms yesterday Discussed adding loratadine if allergies act up

## 2015-12-26 NOTE — Progress Notes (Signed)
Pre visit review using our clinic review tool, if applicable. No additional management support is needed unless otherwise documented below in the visit note. 

## 2015-12-26 NOTE — Telephone Encounter (Signed)
I spoke with pt and she has stopped the sudafed and the prednisone. Today she is still having dizziness. Pt scheduled appt 12/26/15 at 12:15 with Dr Silvio Pate but pt still wants to speak with Dr Deborra Medina; advised pt Dr Deborra Medina is seeing pts this morning and pt voiced understanding.pt said in the past Dr Deborra Medina has worked pt in because pt does not like to see other doctors than her PCP. Dr Deborra Medina in room with pt and [facility] CMA spoke with pt. Pt does not need cb and will see Dr Silvio Pate per [facility] CMA.

## 2015-12-26 NOTE — Patient Instructions (Signed)
You can add loratadine 10mg  1-2 daily if your allergies are still acting up despite your current medications.

## 2015-12-26 NOTE — Telephone Encounter (Signed)
Noted Make sure Dr A didn't want to work her in

## 2015-12-26 NOTE — Telephone Encounter (Signed)
PLEASE NOTE: All timestamps contained within this report are represented as [place] Standard Time. CONFIDENTIALTY NOTICE: This fax transmission is intended only for the addressee. It contains information that is legally privileged, confidential or otherwise protected from use or disclosure. If you are not the intended recipient, you are strictly prohibited from reviewing, disclosing, copying using or disseminating any of this information or taking any action in reliance on or regarding this information. If you have received this fax in error, please notify us immediately by telephone so that we can arrange for its return to [place]. Phone: [phone], Toll-Free: [phone], Fax: [phone] Page: 1 of 2 Call Id: [id number] [place] Patient Name: Mackenzie Shea Gender: Female DOB: [birth year] Age: 35 Y 2 D Return Phone Number: [phone] (Primary) Address: City/State/Zip: [place] [address] Client [facility] Night - Client Client Site [place] Physician Arnette Norris Contact Type Call Who Is Calling Patient / Member / Family / Caregiver Call Type Triage / Clinical Relationship To Patient Self Return Phone Number [phone] (Primary) Chief Complaint Dizziness Reason for Call Symptomatic / Request for [place] she is on medication for her symptoms, but things maybe the medication is too strong, is still having some dizziness PreDisposition Call Doctor Translation No Nurse Assessment Nurse: Richardson Landry, RN, Nehemiah Settle Date/Time (Eastern Time): 12/25/2015 8:51:06 PM Confirm and document reason for call. If symptomatic, describe symptoms. You must click the next button to save text entered. ---caller states dizziness started last week on prednisone with 12 hr Sudafed Has the patient traveled out of the  country within the last 30 days? ---No Does the patient have any new or worsening symptoms? ---Yes Will a triage be completed? ---Yes Related visit to physician within the last 2 weeks? ---Yes Does the PT have any chronic conditions? (i.e. diabetes, asthma, etc.) ---Yes List chronic conditions. ---asthma Is the patient pregnant or possibly pregnant? (Ask all females between the ages of 67-55) ---No Is this a behavioral health or substance abuse call? ---No Guidelines Guideline Title Affirmed Question Affirmed Notes Nurse Date/Time (Eastern Time) Dizziness - Lightheadedness [1] MODERATE dizziness (e.g., interferes with normal activities) AND [2] has NOT been evaluated by physician for this (Exception: dizziness caused by heat exposure, Richardson Landry, RN, Nehemiah Settle 12/25/2015 8:56:45 PM PLEASE NOTE: All timestamps contained within this report are represented as [place] Standard Time. CONFIDENTIALTY NOTICE: This fax transmission is intended only for the addressee. It contains information that is legally privileged, confidential or otherwise protected from use or disclosure. If you are not the intended recipient, you are strictly prohibited from reviewing, disclosing, copying using or disseminating any of this information or taking any action in reliance on or regarding this information. If you have received this fax in error, please notify us immediately by telephone so that we can arrange for its return to [place]. Phone: [phone], Toll-Free: [phone], Fax: [phone] Page: 2 of 2 Call Id: [id number] Guidelines Guideline Title Affirmed Question Affirmed Notes Nurse Date/Time Eilene Ghazi Time) sudden standing, or poor fluid intake) Disp. Time Eilene Ghazi Time) Disposition Final User 12/25/2015 9:05:30 PM Paged On Call back to [facility] [employer], RN, Nehemiah Settle 12/25/2015 9:15:56 PM Call Completed Richardson Landry, RN, Nehemiah Settle 12/25/2015 9:04:13 PM See Physician within 24 Hours Yes Richardson Landry, RN, Lewayne Bunting  Understands: Yes Disagree/Comply: Comply Care Advice Given Per Guideline SEE PHYSICIAN WITHIN 24 HOURS: * IF OFFICE WILL BE OPEN: You need  to be seen within the next 24 hours. Call your doctor when the office opens, and make an appointment. FLUIDS: Drink several glasses of fruit juice, other clear fluids or water. This will improve hydration and blood glucose. If the weather is hot, make sure the fluids are cold. REST: Lie down with feet elevated for 1 hour. This will improve circulation and increase blood flow to the brain. CALL BACK IF: * You become worse. * Passes out (faints) CARE ADVICE given per Dizziness (Adult) guideline. Comments User: Colon Flattery, RN Date/Time Eilene Ghazi Time): 12/25/2015 9:15:26 PM notify caller that on-call doctor said it is okay to stop medication and call office in the morning Referrals REFERRED TO PCP OFFICE Paging [place] Phone DateTime Result/Outcome Message Type Notes Kathlene November [id number] 12/25/2015 9:05:30 PM Paged On Call Back to Call Center Doctor Paged please call Nehemiah Settle at call center at [phone] Kathlene November 12/25/2015 9:12:06 PM Spoke with On Call - General Message Result Doctor stated it is okay to stop medication and to call office in the morning

## 2015-12-27 ENCOUNTER — Telehealth: Payer: Self-pay

## 2015-12-27 MED ORDER — CEFDINIR 300 MG PO CAPS: 300.0000 mg | ORAL_CAPSULE | Freq: Two times a day (BID) | ORAL | Status: DC

## 2015-12-27 NOTE — Telephone Encounter (Signed)
Having more sinus pressure now Ongoing respiratory symptoms---not sure if she might have infection Symptoms started ~9 days ago Still dizzy On antihistamines and tried the meclizine last night  Will send Rx for antibiotic May want to try prednisone again but only 40mg 

## 2015-12-27 NOTE — Telephone Encounter (Signed)
Pt left v/m;pt was seen on 12/26/15; pt stopped prednisone and is taking allergy regimen with loratadine. Now  pressure on rt side of face has gotten worse, pain above pts eye and goes around to back of head and neck; still dizziness with head movement. Pt wants to know what else to do about the persistent symptoms. Pt request cb. [facility] [address].

## 2016-01-02 ENCOUNTER — Telehealth: Payer: Self-pay | Admitting: Family Medicine

## 2016-01-02 NOTE — Telephone Encounter (Signed)
Spoke to the patient. She said she is not having any other symptoms now other than being dizzy. She has been taking the prednisone in lower dosage taper. She is still on the antibiotic, also. She said any eye or head movement causes the dizziness. She did try the meclizine, but can only take it at night because it causes drowsiness. What should she do and what is the next step?

## 2016-01-02 NOTE — Telephone Encounter (Signed)
Pt called - she is requesting a cb regarding symptoms. She was see on 04/26,  It has been a week being on the meds and pt is dizzy and having a hard time working. Pt request cb at [phone] Thank you

## 2016-01-03 NOTE — Telephone Encounter (Signed)
LEFT MESSAGE TO CALL BACK

## 2016-01-03 NOTE — Telephone Encounter (Signed)
Spoke to patient. She is feeling a little better today. Still has a lot of sinus pressure. I suggested she try using a Netti-Pot. Made her an appointment for Monday.

## 2016-01-03 NOTE — Telephone Encounter (Signed)
She probably needs a reevaluation--this has gone on a long time and we need to consider other possibilities

## 2016-01-03 NOTE — Telephone Encounter (Signed)
Pt returned your call - please call back, says you already have the best number  thanks

## 2016-01-07 ENCOUNTER — Telehealth: Payer: Self-pay

## 2016-01-07 ENCOUNTER — Ambulatory Visit: Payer: BLUE CROSS/BLUE SHIELD | Admitting: Internal Medicine

## 2016-01-07 NOTE — Telephone Encounter (Signed)
Spoke to pt since her appointment was changed today. She made an appointment with Dr Deborra Medina.

## 2016-01-08 ENCOUNTER — Encounter: Payer: Self-pay | Admitting: Family Medicine

## 2016-01-08 ENCOUNTER — Encounter: Payer: Self-pay | Admitting: *Deleted

## 2016-01-08 ENCOUNTER — Ambulatory Visit (INDEPENDENT_AMBULATORY_CARE_PROVIDER_SITE_OTHER): Payer: BLUE CROSS/BLUE SHIELD | Admitting: Family Medicine

## 2016-01-08 VITALS — BP 134/80 | HR 122 | Temp 97.3°F | Wt 160.5 lb

## 2016-01-08 DIAGNOSIS — R519 Headache, unspecified: Secondary | ICD-10-CM

## 2016-01-08 DIAGNOSIS — R42 Dizziness and giddiness: Secondary | ICD-10-CM

## 2016-01-08 DIAGNOSIS — R51 Headache: Secondary | ICD-10-CM

## 2016-01-08 DIAGNOSIS — M542 Cervicalgia: Secondary | ICD-10-CM

## 2016-01-08 MED ORDER — CYCLOBENZAPRINE HCL 5 MG PO TABS: 5.0000 mg | ORAL_TABLET | Freq: Three times a day (TID) | ORAL | Status: DC | PRN

## 2016-01-08 NOTE — Patient Instructions (Signed)
Good to see you. Please stop by to see Rosaria Ferries on your out.

## 2016-01-08 NOTE — Progress Notes (Signed)
Subjective:   Patient ID: Mackenzie Shea, female    DOB: [birth year], 35 y.o.   MRN: [id number]  Mackenzie Shea is a pleasant 35 y.o. year old female who presents to clinic today with Follow-up  on 01/08/2016  HPI:  Dizziness- was seen by Dr. Silvio Pate on 12/26/15 for this complaint. Note reviewed.  Was seen at [facility] prior to at office visit and was given prednisone for serous otitis along with sudafed. Stopped taking the prednisone because it caused palpitations and "made me feel crazy."  On 4/26, Dr. Silvio Pate felt likely vestibular and advised adding loratadine if allergy symptoms act up. Also given cefdinir.  Returned to UC 4 days ago, placed on zpack.  Has one more dose of zpack left.  Here today because she is still having concerning symptoms.  Dizziness has improved but states she still has a "funny sensation" in her head. Pressure in back of her head and behind her eyes when she moves her eyes, not her head.  No nausea or vomiting.  No gait imbalance. No visual changes but does feel like "something is wrong with my eyes."   Current Outpatient Prescriptions on File Prior to Visit  Medication Sig Dispense Refill  . cefdinir (OMNICEF) 300 MG capsule Take 1 capsule (300 mg total) by mouth 2 (two) times daily. 20 capsule 0  . cetirizine (ZYRTEC) 10 MG tablet Take 10 mg by mouth at bedtime.    . ferrous sulfate 325 (65 FE) MG tablet Take 1 tablet (325 mg total) by mouth 2 (two) times daily with a meal. 60 tablet 3  . fluticasone (VERAMYST) 27.5 MCG/SPRAY nasal spray Place 2 sprays into the nose daily. Reported on 12/22/2015    . levalbuterol (XOPENEX HFA) 45 MCG/ACT inhaler INHALE ONE TO TWO PUFFS INTO LUNGS EVERY 4 HOURS AS NEEDED 15 g 2  . levalbuterol (XOPENEX) 0.63 MG/3ML nebulizer solution Take 0.63 mg by nebulization every 4 (four) hours as needed for wheezing or shortness of breath.    . montelukast (SINGULAIR) 10 MG tablet Take 1 tablet (10 mg total) by mouth at bedtime. 30 tablet  11  . omeprazole (PRILOSEC) 20 MG capsule TAKE ONE CAPSULE BY MOUTH EVERY DAY 30 capsule 5  . predniSONE (DELTASONE) 20 MG tablet 4/4/3/3/2/2/1/1 single daily dose for 8 days 20 tablet 0  . pseudoephedrine (SUDAFED) 120 MG 12 hr tablet Take 120 mg by mouth every 12 (twelve) hours as needed for congestion.     No current facility-administered medications on file prior to visit.    Allergies  Allergen Reactions  . Penicillins Hives    Past Medical History  Diagnosis Date  . Personal history of traumatic fracture   . Esophageal reflux   . Extrinsic asthma, unspecified   . Unspecified vitamin D deficiency   . Allergic rhinitis, cause unspecified   . Leukocytosis, unspecified   . Elevated blood pressure reading without diagnosis of hypertension   . Pain in joint, shoulder region   . Allergy   . Anemia     No past surgical history on file.  Family History  Problem Relation Age of Onset  . Heart disease Father     Social History   Social History  . Marital Status: Single    Spouse Name: N/A  . Number of Children: N/A  . Years of Education: N/A   Occupational History  . Not on file.   Social History Main Topics  . Smoking status: Never Smoker   .  Smokeless tobacco: Never Used  . Alcohol Use: No  . Drug Use: No  . Sexual Activity: Not on file   Other Topics Concern  . Not on file   Social History Narrative   The PMH, PSH, Social History, Family History, Medications, and allergies have been reviewed in [facility], and have been updated if relevant.  Review of Systems  Constitutional: Negative for fever.  HENT: Negative for congestion, dental problem, drooling, ear discharge, ear pain, facial swelling, hearing loss, mouth sores, nosebleeds, postnasal drip, rhinorrhea, sinus pressure, sneezing, sore throat, tinnitus, trouble swallowing and voice change.   Eyes: Negative.  Negative for visual disturbance.  Respiratory: Negative.   Endocrine: Negative.   Genitourinary:  Negative.   Musculoskeletal: Negative.   Skin: Negative.   Neurological: Positive for dizziness and headaches. Negative for tremors, seizures, syncope, facial asymmetry, speech difficulty, weakness, light-headedness and numbness.  Hematological: Negative.   Psychiatric/Behavioral: Negative.   All other systems reviewed and are negative.      Objective:    BP 134/80 mmHg  Pulse 122  Temp(Src) 97.3 F (36.3 C) (Oral)  Wt 160 lb 8 oz (72.802 kg)  SpO2 99%  LMP 12/20/2015   Physical Exam  Constitutional: She is oriented to person, place, and time. She appears well-developed and well-nourished. No distress.  HENT:  Head: Normocephalic and atraumatic.  Nose: Right sinus exhibits no maxillary sinus tenderness and no frontal sinus tenderness. Left sinus exhibits no maxillary sinus tenderness and no frontal sinus tenderness.  + cerumen right ear, no cerumen, unremarkable TM on left.  Eyes:  No nystagmus  Neck:  +pain in back of her head and behind her eyes when she touches her chin to her chest  Pulmonary/Chest: Effort normal.  Musculoskeletal: Normal range of motion.  Neurological: She is alert and oriented to person, place, and time. No cranial nerve deficit.  Skin: Skin is warm and dry. She is not diaphoretic.  Psychiatric: She has a normal mood and affect. Her behavior is normal. Judgment and thought content normal.  Nursing note and vitals reviewed.         Assessment & Plan:   Dizziness - Plan: Ambulatory referral to ENT, MR Brain W Wo Contrast  Pressure in head - Plan: MR Brain W Wo Contrast No Follow-up on file.

## 2016-01-08 NOTE — Assessment & Plan Note (Signed)
Agree initially seemed vestibular but now her new complaints are more worrisome.  Will refer to ENT for further work up and ? Vestibular rehab if appropriate. MRI of brain given new occipital head pressure and eye symptoms to rule out tumors, lesions,other abnormal findings. The patient indicates understanding of these issues and agrees with the plan. Orders Placed This Encounter  Procedures  . MR Brain W Wo Contrast  . Ambulatory referral to ENT

## 2016-01-08 NOTE — Progress Notes (Signed)
Pre visit review using our clinic review tool, if applicable. No additional management support is needed unless otherwise documented below in the visit note. 

## 2016-01-08 NOTE — Addendum Note (Signed)
Addended by: Lucille Passy on: 01/08/2016 12:46 PM   Modules accepted: Orders

## 2016-01-11 ENCOUNTER — Other Ambulatory Visit: Payer: Self-pay

## 2016-01-11 MED ORDER — LORAZEPAM 0.5 MG PO TABS: 0.5000 mg | ORAL_TABLET | Freq: Every day | ORAL | Status: DC | PRN

## 2016-01-11 NOTE — Telephone Encounter (Signed)
Pt left v/m requesting refill on old rx for lorazepam;helps with neck and shoulder pain related to stress; last refilled # 30 x 1 on 02/18/12. And pt request rx for shoulder and neck pain like a anti inflammatory med to [facility]. Pt request cb.pt last seen 01/08/16.

## 2016-01-14 ENCOUNTER — Encounter: Payer: Self-pay | Admitting: Family Medicine

## 2016-01-14 ENCOUNTER — Ambulatory Visit: Payer: BLUE CROSS/BLUE SHIELD | Admitting: Family Medicine

## 2016-01-14 ENCOUNTER — Ambulatory Visit (INDEPENDENT_AMBULATORY_CARE_PROVIDER_SITE_OTHER): Payer: BLUE CROSS/BLUE SHIELD | Admitting: Family Medicine

## 2016-01-14 VITALS — BP 144/92 | HR 122 | Temp 98.8°F | Resp 20 | Wt 160.2 lb

## 2016-01-14 DIAGNOSIS — R350 Frequency of micturition: Secondary | ICD-10-CM | POA: Insufficient documentation

## 2016-01-14 DIAGNOSIS — G44209 Tension-type headache, unspecified, not intractable: Secondary | ICD-10-CM

## 2016-01-14 LAB — POC URINALSYSI DIPSTICK (AUTOMATED)
Bilirubin, UA: NEGATIVE
GLUCOSE UA: NEGATIVE
KETONES UA: NEGATIVE
Leukocytes, UA: NEGATIVE
Nitrite, UA: NEGATIVE
PROTEIN UA: NEGATIVE
RBC UA: NEGATIVE
SPEC GRAV UA: 1.02
UROBILINOGEN UA: 0.2
pH, UA: 7

## 2016-01-14 MED ORDER — NAPROXEN 500 MG PO TABS: ORAL_TABLET | ORAL | Status: DC

## 2016-01-14 NOTE — Patient Instructions (Signed)
Continue flexeril at least at night for 7 days.  Naproxen twice a day scheduled for 7 days (with food) and then as needed.  Heat therpay for 20 minutes at a time.  Consider a massage.  Continue f/u with ENT/MRI Consider anti-anxiety daily medication

## 2016-01-14 NOTE — Telephone Encounter (Signed)
Rx called in to requested pharmacy 

## 2016-01-14 NOTE — Progress Notes (Signed)
Patient ID: Mackenzie Shea, female   DOB: [birth year], 35 y.o.   MRN: [id number]    Mackenzie Shea , 10-May-1981, 35 y.o., female MRN: [id number]  CC: fever Subjective: Pt presents with fever, suprapubic pressure, urinary frequency. No burning with urination. Low grade fever TMAX 100.3 F at home. She has not had a UTI in the  Past she recalls. She is being evaluated/treated for vertigo, tachycardia, migraine. She saw ENT last week, diagonsed Vertigo, Migraine and anxiety. Her stress level is extremely high secondary to a relative that is being treated for illness in the hospital. She is prescribed Ativan 0.5 mg BID, which she states helps, takes the edge off. Patient does have a history of large fibroids. Patient admits to continued tension headaches in her bilateral temples, which she states this feels like pressure. She admits to severe tension throughout her neck and shoulders. She admits to going through a rough time in which her stress level is highly increased. She has recently been treated with Flexeril, Ativan, prednisone, Z-Pak, ranitidine, Sudafed, cefdinir. MR brain schedule tomorrow. MR cervical spine ordered by prior provider. Patient's last menstrual period was 12/20/2015.    Allergies  Allergen Reactions  . Penicillins Hives  . Pseudoephedrine Hcl Palpitations   Social History  Substance Use Topics  . Smoking status: Never Smoker   . Smokeless tobacco: Never Used  . Alcohol Use: No   Past Medical History  Diagnosis Date  . Personal history of traumatic fracture   . Esophageal reflux   . Extrinsic asthma, unspecified   . Unspecified vitamin D deficiency   . Allergic rhinitis, cause unspecified   . Leukocytosis, unspecified   . Elevated blood pressure reading without diagnosis of hypertension   . Pain in joint, shoulder region   . Allergy   . Anemia    History reviewed. No pertinent past surgical history. Family History  Problem Relation Age of Onset  . Heart disease  Father      Medication List       This list is accurate as of: 01/14/16  3:02 PM.  Always use your most recent med list.               cetirizine 10 MG tablet  Commonly known as:  ZYRTEC  Take 10 mg by mouth at bedtime.     cyclobenzaprine 5 MG tablet  Commonly known as:  FLEXERIL  Take 1 tablet (5 mg total) by mouth 3 (three) times daily as needed for muscle spasms.     ferrous sulfate 325 (65 FE) MG tablet  Take 1 tablet (325 mg total) by mouth 2 (two) times daily with a meal.     fluticasone 27.5 MCG/SPRAY nasal spray  Commonly known as:  VERAMYST  Place 2 sprays into the nose daily. Reported on 12/22/2015     IMITREX 25 MG tablet  Generic drug:  SUMAtriptan  Take 25 mg by mouth.     levalbuterol 0.63 MG/3ML nebulizer solution  Commonly known as:  XOPENEX  Take 0.63 mg by nebulization every 4 (four) hours as needed for wheezing or shortness of breath.     levalbuterol 45 MCG/ACT inhaler  Commonly known as:  XOPENEX HFA  INHALE ONE TO TWO PUFFS INTO LUNGS EVERY 4 HOURS AS NEEDED     LORazepam 0.5 MG tablet  Commonly known as:  ATIVAN  Take 1 tablet (0.5 mg total) by mouth daily as needed.     meclizine 12.5 MG tablet  Commonly  known as:  ANTIVERT  Take 12.5 mg by mouth.     montelukast 10 MG tablet  Commonly known as:  SINGULAIR  Take 1 tablet (10 mg total) by mouth at bedtime.     omeprazole 20 MG capsule  Commonly known as:  PRILOSEC  TAKE ONE CAPSULE BY MOUTH EVERY DAY       ROS: Negative, with the exception of above mentioned in HPI  Objective:  BP 144/92 mmHg  Pulse 122  Temp(Src) 98.8 F (37.1 C)  Resp 20  Wt 160 lb 4 oz (72.689 kg)  SpO2 96%  LMP 12/20/2015 Body mass index is 25.88 kg/(m^2). Gen: Afebrile. No acute distress. Nontoxic in appearance. Well-developed, well-nourished African-American female. HENT: AT. .  MMM, no oral lesions.  Eyes:Pupils Equal Round Reactive to light, Extraocular movements intact,  Conjunctiva without  redness, discharge or icterus. Neck/lymp/endocrine: Supple, no lymphadenopathy CV: RRR, no edema Chest: CTAB, no wheeze or crackles. Good air movement, normal resp effort.  Abd: Soft. Enlarged fibroid uterus at umbilicus. NTND. BS present. No rebound or guarding.  MSK: No CVA tenderness bilaterally Neuro: Normal gait. PERLA. EOMi. Alert. Oriented x3  Psych: Anxious. Otherwise,Normal affect, dress and demeanor. Normal speech. Normal thought content and judgment.   Assessment/Plan: [name] is a 35 y.o. female present for acute OV for  Frequent urination - Discussed with patient today her urinalysis is completely normal. Her increased feelings in need of urination could be secondary to side effect of medication, versus anxiety, versus increasing fibroid size pressing against bladder. Patient reported fever, however no fever on exam today, and she has recently been treated with 2 different types of antibiotics and evaluated by ENT. - Discussed with her urinary symptoms continue and continue reevaluation on her fibroid uterus. Patient states this time of the month her uterus becomes more swollen and after her menstrual cycle improves. - POCT Urinalysis Dipstick (Automated)--> negative  Tension headache - Patient is under a great deal of stress. No red flags on exam today. Discussed with her today that it may be prudent to visit a every day medication for anxiety, as the past to using Ativan as needed. If patient feels this is something she is interested in doing, I encouraged her to make an appointment to discuss in detail with depression screening, anxiety screening and mood disorder screening can be completed. - I encouraged her to use naproxen 500 mg twice a day for 7 days scheduled, and then take daily twice a day when necessary as needed for headache. Encouraged her to use the Flexeril at least daily at bedtime scheduled for 7 days. She has not been taking this secondary to sedation.  Advised her to massage/heat therapy will be helpful as well. - naproxen (NAPROSYN) 500 MG tablet; 500 mg BID for 7 days, and then BID PRN  Dispense: 30 tablet; Refill: 0 - Patient was encouraged to follow-up with ENT and her PCP has advised prior for prior workup. Including having her MRI completed.    electronically signed by:  Howard Pouch, DO  [place]

## 2016-01-15 ENCOUNTER — Ambulatory Visit
Admission: RE | Admit: 2016-01-15 | Discharge: 2016-01-15 | Disposition: A | Discharge status: Home / Self Care | Payer: BLUE CROSS/BLUE SHIELD | Source: Ambulatory Visit | Attending: Family Medicine | Admitting: Family Medicine

## 2016-01-15 ENCOUNTER — Other Ambulatory Visit: Payer: BLUE CROSS/BLUE SHIELD

## 2016-01-15 DIAGNOSIS — R42 Dizziness and giddiness: Secondary | ICD-10-CM

## 2016-01-15 DIAGNOSIS — R519 Headache, unspecified: Secondary | ICD-10-CM

## 2016-01-15 DIAGNOSIS — R51 Headache: Secondary | ICD-10-CM

## 2016-01-15 MED ORDER — GADOBENATE DIMEGLUMINE 529 MG/ML IV SOLN
15.0000 mL | Freq: Once | INTRAVENOUS | Status: AC | PRN
  Administered 2016-01-15: 15 mL via INTRAVENOUS

## 2016-01-18 ENCOUNTER — Telehealth: Payer: Self-pay | Admitting: Family Medicine

## 2016-01-18 NOTE — Telephone Encounter (Signed)
[place]    --------------------------------------------------------------------------------   Patient Name: Mackenzie Shea  Gender: Female  DOB: 28-Nov-1980   Age: 35 Y 25 D  Return Phone Number: [phone] (Primary)  Address:     [place]  [address]   Client [facility] - Client  Client Site [facility] - Day  Physician Arnette Norris - MD  Contact Type Call  Who Is Calling Patient / Member / Family / Caregiver  Call Type Triage / Clinical  Caller Name Miho Tune  Relationship To Patient Self  Return Phone Number [phone] (Primary)  Chief Complaint Headache  Reason for Call Symptomatic / Request for [place] states she is wondering if she can take propranolol 20mg  and how much she can take. Her blood pressure is 160/108, and she doesn't usually have a high blood pressure but she has been under a lot of stress and anxiety. Head is feeling heavy and pressure. Heart rate is 124 and it has been elevated for the past couple of weeks. She had vertigo she couldn't get in control previously.  PreDisposition Call Doctor  Translation No       Nurse Assessment  Nurse: Amalia Hailey, RN, Melissa Date/Time (Eastern Time): 01/18/2016 5:18:41 PM  Confirm and document reason for call. If symptomatic, describe symptoms. You must click the next button to save text entered. ---Caller states she is wondering if she can take propranolol 20mg  and how much she can take. Her blood pressure is 160/108, and she doesn't usually have a high blood pressure but she has been under a lot of stress and anxiety. Head is feeling heavy and pressure. Heart rate is 124 and it has been elevated for the past couple of weeks. She had vertigo she couldn't get in control previously.    Has the patient traveled out of the country  within the last 30 days? ---Not Applicable    Does the patient have any new or worsening symptoms? ---Yes    Will a triage be completed? ---Yes    Related visit to physician within the last 2 weeks? ---No    Does the PT have any chronic conditions? (i.e. diabetes, asthma, etc.) ---Yes    List chronic conditions. ---inner ear problems, has allergies, asthma, Vit D. Defiency    Is the patient pregnant or possibly pregnant? (Ask all females between the ages of 73-55) ---No    Is this a behavioral health or substance abuse call? ---No           Guidelines          Guideline Title Affirmed Question Affirmed Notes Nurse Date/Time (Eastern Time)  High Blood Pressure BP ? 160/100    Evans, RN, Melissa 01/18/2016 5:24:25 PM    Disp. Time Eilene Ghazi Time) Disposition Final User    01/18/2016 5:02:03 PM Attempt made - message left   Amalia Hailey, RN, Lenna Sciara      01/18/2016 5:29:03 PM See PCP When Office is Open (within 3 days) Yes Amalia Hailey, RN, Lenna Sciara            Caller Understands: Yes  Disagree/Comply: Comply       Care Advice Given Per Guideline        SEE PCP WITHIN 3 DAYS: CALL BACK IF: * Weakness or numbness of the face, arm or leg on one side of the body  occurs * Difficulty walking, difficulty talking, or severe headache occurs * Chest pain or difficulty breathing occurs * Your blood pressure is over 180/110 * You become worse. * EXERCISE, BE MORE PHYSICALLY ACTIVE: Do at least 30 minutes of aerobic exercise (e.g., brisk walking) most days of the week. Other examples of aerobic activities cycling, jogging, and swimming. * REDUCE STRESS: Find activities that help reduce your stress. Examples might include meditation, yoga, or even a restful walk in a park. LIFESTYLE MODIFICATIONS - The following things can help you reduce your blood pressure: HIGH BLOOD PRESSURE: * Untreated high blood pressure may cause damage to your heart, brain, kidneys, and eyes. * You need to be seen within 2 or 3 days. Call  your doctor during regular office hours and make an appointment. An urgent care center is often the best source of care if your doctor's office is closed or you can't get an appointment. NOTE: If office will be open tomorrow, tell caller to call then, not in 3 days.        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 Caller concerned if she needed to be seen before her appt on Monday. is the reason she called today.

## 2016-01-21 ENCOUNTER — Encounter: Payer: Self-pay | Admitting: Family Medicine

## 2016-01-21 ENCOUNTER — Other Ambulatory Visit: Payer: Self-pay

## 2016-01-21 ENCOUNTER — Ambulatory Visit (INDEPENDENT_AMBULATORY_CARE_PROVIDER_SITE_OTHER): Payer: BLUE CROSS/BLUE SHIELD | Admitting: Family Medicine

## 2016-01-21 VITALS — BP 134/86 | HR 101 | Temp 98.2°F | Wt 158.5 lb

## 2016-01-21 DIAGNOSIS — R42 Dizziness and giddiness: Secondary | ICD-10-CM | POA: Diagnosis not present

## 2016-01-21 DIAGNOSIS — IMO0001 Reserved for inherently not codable concepts without codable children: Secondary | ICD-10-CM

## 2016-01-21 DIAGNOSIS — R030 Elevated blood-pressure reading, without diagnosis of hypertension: Secondary | ICD-10-CM | POA: Diagnosis not present

## 2016-01-21 DIAGNOSIS — F411 Generalized anxiety disorder: Secondary | ICD-10-CM | POA: Diagnosis not present

## 2016-01-21 MED ORDER — SERTRALINE HCL 25 MG PO TABS: 25.0000 mg | ORAL_TABLET | Freq: Every day | ORAL | Status: DC

## 2016-01-21 MED ORDER — SUMATRIPTAN SUCCINATE 25 MG PO TABS: 25.0000 mg | ORAL_TABLET | ORAL | Status: DC | PRN

## 2016-01-21 MED ORDER — PROPRANOLOL HCL 20 MG PO TABS: 20.0000 mg | ORAL_TABLET | Freq: Three times a day (TID) | ORAL | Status: DC

## 2016-01-21 NOTE — Telephone Encounter (Signed)
Pt has appt with Dr Deborra Medina on 01/21/16 at 3:45.

## 2016-01-21 NOTE — Progress Notes (Signed)
Pre visit review using our clinic review tool, if applicable. No additional management support is needed unless otherwise documented below in the visit note. 

## 2016-01-21 NOTE — Progress Notes (Signed)
Subjective:   Patient ID: Mackenzie Shea, female    DOB: 05/16/1981, 35 y.o.   MRN: [id number]  LYANI Shea is a pleasant 35 y.o. year old female who presents to clinic today with Follow-up and Hypertension  on 01/21/2016  HPI:  Dizziness- was seen by Dr. Silvio Pate on 12/26/15 for this complaint. Note reviewed.  Was seen at [facility] prior to at office visit and was given prednisone for serous otitis along with sudafed. Stopped taking the prednisone because it caused palpitations and "made me feel crazy."  On 4/26, Dr. Silvio Pate felt likely vestibular and advised adding loratadine if allergy symptoms act up. Also given cefdinir.  Returned to UC 4 days ago, placed on zpack.    I then saw her on 01/08/16 with persistent dizziness and  a "funny sensation" in her head. Pressure in back of her head and behind her eyes when she moves her eyes, not her head.  No nausea or vomiting.  No gait imbalance. No visual changes but does feel like "something is wrong with my eyes."  I therefore ordered an MRI of her head and referred her to ENT.  ENT advised pt that a lot of her symptoms may be due to anxiety.  MRI was negative.    EXAM: MRI HEAD WITHOUT AND WITH CONTRAST  TECHNIQUE: Multiplanar, multiecho pulse sequences of the brain and surrounding structures were obtained without and with intravenous contrast.  CONTRAST: 64mL MULTIHANCE GADOBENATE DIMEGLUMINE 529 MG/ML IV SOLN  COMPARISON: None.  FINDINGS: Ventricle size is normal. Cerebral volume is normal.  Negative for acute or chronic infarction.  Negative for demyelinating disease. Cerebral white matter signal normal. Brainstem and cerebellum normal.  Negative for intracranial hemorrhage.  Negative for mass or edema.  Normal enhancement. No enhancing mass lesion. Normal vascular enhancement. Dural sinuses are patent.  Pituitary normal in size. Normal skullbase.  Mild mucosal edema paranasal sinuses. Mastoid sinus  clear bilaterally.  IMPRESSION: Negative  She has been more anxious lately.  Restarted propranolol 20 mg three times daily that cardiologist gave her.  BP has improved but still anxious.  Current Outpatient Prescriptions on File Prior to Visit  Medication Sig Dispense Refill  . cetirizine (ZYRTEC) 10 MG tablet Take 10 mg by mouth at bedtime.    . cyclobenzaprine (FLEXERIL) 5 MG tablet Take 1 tablet (5 mg total) by mouth 3 (three) times daily as needed for muscle spasms. 30 tablet 1  . ferrous sulfate 325 (65 FE) MG tablet Take 1 tablet (325 mg total) by mouth 2 (two) times daily with a meal. 60 tablet 3  . fluticasone (VERAMYST) 27.5 MCG/SPRAY nasal spray Place 2 sprays into the nose daily. Reported on 12/22/2015    . levalbuterol (XOPENEX HFA) 45 MCG/ACT inhaler INHALE ONE TO TWO PUFFS INTO LUNGS EVERY 4 HOURS AS NEEDED 15 g 2  . levalbuterol (XOPENEX) 0.63 MG/3ML nebulizer solution Take 0.63 mg by nebulization every 4 (four) hours as needed for wheezing or shortness of breath.    Marland Kitchen LORazepam (ATIVAN) 0.5 MG tablet Take 1 tablet (0.5 mg total) by mouth daily as needed. 30 tablet 1  . montelukast (SINGULAIR) 10 MG tablet Take 1 tablet (10 mg total) by mouth at bedtime. 30 tablet 11  . naproxen (NAPROSYN) 500 MG tablet 500 mg BID for 7 days, and then BID PRN 30 tablet 0  . omeprazole (PRILOSEC) 20 MG capsule TAKE ONE CAPSULE BY MOUTH EVERY DAY 30 capsule 5  . SUMAtriptan (IMITREX) 25 MG  tablet Take 25 mg by mouth.     No current facility-administered medications on file prior to visit.    Allergies  Allergen Reactions  . Penicillins Hives  . Pseudoephedrine Hcl Palpitations    Past Medical History  Diagnosis Date  . Personal history of traumatic fracture   . Esophageal reflux   . Extrinsic asthma, unspecified   . Unspecified vitamin D deficiency   . Allergic rhinitis, cause unspecified   . Leukocytosis, unspecified   . Elevated blood pressure reading without diagnosis of  hypertension   . Pain in joint, shoulder region   . Allergy   . Anemia     No past surgical history on file.  Family History  Problem Relation Age of Onset  . Heart disease Father     Social History   Social History  . Marital Status: Single    Spouse Name: N/A  . Number of Children: N/A  . Years of Education: N/A   Occupational History  . Not on file.   Social History Main Topics  . Smoking status: Never Smoker   . Smokeless tobacco: Never Used  . Alcohol Use: No  . Drug Use: No  . Sexual Activity: Not on file   Other Topics Concern  . Not on file   Social History Narrative   The PMH, PSH, Social History, Family History, Medications, and allergies have been reviewed in [facility], and have been updated if relevant.  Review of Systems  Constitutional: Negative for fever.  HENT: Negative for congestion, dental problem, drooling, ear discharge, ear pain, facial swelling, hearing loss, mouth sores, nosebleeds, postnasal drip, rhinorrhea, sinus pressure, sneezing, sore throat, tinnitus, trouble swallowing and voice change.   Eyes: Negative.  Negative for visual disturbance.  Respiratory: Negative.   Endocrine: Negative.   Genitourinary: Negative.   Musculoskeletal: Negative.   Skin: Negative.   Neurological: Positive for dizziness and headaches. Negative for tremors, seizures, syncope, facial asymmetry, speech difficulty, weakness, light-headedness and numbness.  Hematological: Negative.   Psychiatric/Behavioral: Negative for dysphoric mood and decreased concentration. The patient is nervous/anxious.   All other systems reviewed and are negative.      Objective:    BP 134/86 mmHg  Pulse 101  Temp(Src) 98.2 F (36.8 C) (Oral)  Wt 158 lb 8 oz (71.895 kg)  SpO2 98%  LMP 01/18/2016   Physical Exam  Constitutional: She is oriented to person, place, and time. She appears well-developed and well-nourished. No distress.  HENT:  Head: Normocephalic and atraumatic.    Nose: Right sinus exhibits no maxillary sinus tenderness and no frontal sinus tenderness. Left sinus exhibits no maxillary sinus tenderness and no frontal sinus tenderness.  Eyes: Conjunctivae and EOM are normal. Pupils are equal, round, and reactive to light.  Neck: Normal range of motion.  Cardiovascular: Normal rate and regular rhythm.   Pulmonary/Chest: Effort normal and breath sounds normal.  Musculoskeletal: Normal range of motion.  Neurological: She is alert and oriented to person, place, and time. No cranial nerve deficit.  Skin: Skin is warm and dry. She is not diaphoretic.  Psychiatric: Her speech is normal and behavior is normal. Judgment and thought content normal. Her mood appears anxious.  Nursing note and vitals reviewed.         Assessment & Plan:   Dizziness  Elevated blood pressure  Anxiety state No Follow-up on file.

## 2016-01-21 NOTE — Telephone Encounter (Signed)
Pt left v/m; pt was seen this afternoon by Dr Deborra Medina and pt forgot to ask for refill of sumatriptan for migraines to [facility]. Pt had been prescribed sumatriptan by ENT.pt thinks ENT ordered # 18 of sumatriptan 25 mg taking one tab q 6 hours. Please advise.

## 2016-01-21 NOTE — Patient Instructions (Signed)
Good to see you. We are starting zoloft 25 mg daily.  Please call me in a few weeks with an update.

## 2016-01-21 NOTE — Assessment & Plan Note (Signed)
Deteriorated. >25 minutes spent in face to face time with patient, >50% spent in counselling or coordination of care concerning anxiety, HTN and headaches. Headaches improving. Continue propranolol, start zoloft 25 mg daily with as needed Ativan. Declined referral to psychotherapist. She will update me in a few weeks.

## 2016-01-23 ENCOUNTER — Encounter: Payer: Self-pay | Admitting: Family Medicine

## 2016-01-25 NOTE — Telephone Encounter (Signed)
Also see additional message

## 2016-01-28 ENCOUNTER — Encounter: Payer: Self-pay | Admitting: Family Medicine

## 2016-01-29 ENCOUNTER — Other Ambulatory Visit: Payer: Self-pay | Admitting: Family Medicine

## 2016-01-29 MED ORDER — LORAZEPAM 0.5 MG PO TABS: 0.5000 mg | ORAL_TABLET | Freq: Three times a day (TID) | ORAL | Status: DC | PRN

## 2016-01-29 MED ORDER — SUMATRIPTAN SUCCINATE 25 MG PO TABS: 50.0000 mg | ORAL_TABLET | ORAL | Status: DC | PRN

## 2016-02-02 ENCOUNTER — Encounter: Payer: Self-pay | Admitting: Family Medicine

## 2016-02-20 ENCOUNTER — Encounter: Payer: Self-pay | Admitting: Family Medicine

## 2016-06-04 ENCOUNTER — Other Ambulatory Visit: Payer: Self-pay | Admitting: Family Medicine

## 2016-07-18 ENCOUNTER — Encounter: Payer: Self-pay | Admitting: Family Medicine

## 2016-07-21 ENCOUNTER — Other Ambulatory Visit: Payer: Self-pay | Admitting: Family Medicine

## 2016-07-21 MED ORDER — LORAZEPAM 0.5 MG PO TABS: 0.5000 mg | ORAL_TABLET | Freq: Three times a day (TID) | ORAL | 0 refills | Status: DC | PRN

## 2016-07-21 NOTE — Telephone Encounter (Signed)
Last f/u 12/2015 

## 2016-07-21 NOTE — Telephone Encounter (Signed)
Rx called in to requested pharmacy 

## 2016-07-29 ENCOUNTER — Other Ambulatory Visit: Payer: Self-pay | Admitting: Family Medicine

## 2016-08-04 ENCOUNTER — Other Ambulatory Visit (INDEPENDENT_AMBULATORY_CARE_PROVIDER_SITE_OTHER): Payer: Managed Care, Other (non HMO)

## 2016-08-04 ENCOUNTER — Other Ambulatory Visit: Payer: Self-pay | Admitting: Family Medicine

## 2016-08-04 DIAGNOSIS — Z01419 Encounter for gynecological examination (general) (routine) without abnormal findings: Secondary | ICD-10-CM

## 2016-08-04 LAB — CBC WITH DIFFERENTIAL/PLATELET
BASOS ABS: 0 10*3/uL (ref 0.0–0.1)
Basophils Relative: 0.4 % (ref 0.0–3.0)
EOS ABS: 0.2 10*3/uL (ref 0.0–0.7)
Eosinophils Relative: 1.9 % (ref 0.0–5.0)
HEMATOCRIT: 42.2 % (ref 36.0–46.0)
HEMOGLOBIN: 14.2 g/dL (ref 12.0–15.0)
LYMPHS PCT: 31.5 % (ref 12.0–46.0)
Lymphs Abs: 2.9 10*3/uL (ref 0.7–4.0)
MCHC: 33.6 g/dL (ref 30.0–36.0)
MCV: 77.8 fl — ABNORMAL LOW (ref 78.0–100.0)
MONO ABS: 0.6 10*3/uL (ref 0.1–1.0)
Monocytes Relative: 6.4 % (ref 3.0–12.0)
Neutro Abs: 5.5 10*3/uL (ref 1.4–7.7)
Neutrophils Relative %: 59.8 % (ref 43.0–77.0)
PLATELETS: 283 10*3/uL (ref 150.0–400.0)
RBC: 5.43 Mil/uL — ABNORMAL HIGH (ref 3.87–5.11)
RDW: 14.1 % (ref 11.5–15.5)
WBC: 9.2 10*3/uL (ref 4.0–10.5)

## 2016-08-04 LAB — COMPREHENSIVE METABOLIC PANEL
ALT: 13 U/L (ref 0–35)
AST: 18 U/L (ref 0–37)
Albumin: 3.8 g/dL (ref 3.5–5.2)
Alkaline Phosphatase: 49 U/L (ref 39–117)
BILIRUBIN TOTAL: 0.3 mg/dL (ref 0.2–1.2)
BUN: 14 mg/dL (ref 6–23)
CALCIUM: 9.2 mg/dL (ref 8.4–10.5)
CHLORIDE: 106 meq/L (ref 96–112)
CO2: 26 meq/L (ref 19–32)
CREATININE: 0.92 mg/dL (ref 0.40–1.20)
GFR: 88.53 mL/min (ref >60.00)
Glucose, Bld: 83 mg/dL (ref 70–99)
Potassium: 4.1 mEq/L (ref 3.5–5.1)
SODIUM: 139 meq/L (ref 135–145)
Total Protein: 6.8 g/dL (ref 6.0–8.3)

## 2016-08-04 LAB — LIPID PANEL
CHOL/HDL RATIO: 3
Cholesterol: 147 mg/dL (ref 0–200)
HDL: 54 mg/dL (ref >39.00)
LDL CALC: 82 mg/dL (ref 0–99)
NONHDL: 93.33
TRIGLYCERIDES: 58 mg/dL (ref 0.0–149.0)
VLDL: 11.6 mg/dL (ref 0.0–40.0)

## 2016-08-04 LAB — TSH: TSH: 0.76 u[IU]/mL (ref 0.35–4.50)

## 2016-08-07 ENCOUNTER — Ambulatory Visit (INDEPENDENT_AMBULATORY_CARE_PROVIDER_SITE_OTHER): Payer: Managed Care, Other (non HMO) | Admitting: Family Medicine

## 2016-08-07 ENCOUNTER — Encounter: Payer: Self-pay | Admitting: Family Medicine

## 2016-08-07 VITALS — BP 124/80 | HR 100 | Temp 97.8°F | Ht 66.25 in | Wt 165.5 lb

## 2016-08-07 DIAGNOSIS — E559 Vitamin D deficiency, unspecified: Secondary | ICD-10-CM

## 2016-08-07 DIAGNOSIS — F411 Generalized anxiety disorder: Secondary | ICD-10-CM

## 2016-08-07 DIAGNOSIS — G43909 Migraine, unspecified, not intractable, without status migrainosus: Secondary | ICD-10-CM

## 2016-08-07 DIAGNOSIS — J329 Chronic sinusitis, unspecified: Secondary | ICD-10-CM

## 2016-08-07 DIAGNOSIS — G43009 Migraine without aura, not intractable, without status migrainosus: Secondary | ICD-10-CM | POA: Insufficient documentation

## 2016-08-07 DIAGNOSIS — Z01419 Encounter for gynecological examination (general) (routine) without abnormal findings: Secondary | ICD-10-CM | POA: Insufficient documentation

## 2016-08-07 NOTE — Assessment & Plan Note (Signed)
Weaned herself off zoloft.  I advised her to keep me updated over the next several weeks.

## 2016-08-07 NOTE — Patient Instructions (Signed)
Great to see you. Happy Holidays and congratulations!

## 2016-08-07 NOTE — Progress Notes (Signed)
Subjective:   Patient ID: Mackenzie Shea, female    DOB: [birth year], 35 y.o.   MRN: [id number]  Mackenzie Shea is a pleasant 35 y.o. year old female who presents to clinic today with Annual Exam  on 08/07/2016  HPI:  G0 virginal.  Last pap smear done by me on 08/06/15- normal.  Does have known uterine fibroids- referred to Dr. Helane Shea in 2014. Since pt was not and still does not desire fertility, it was decided to defer any intervention.  She has four adopted children and is also a foster parent.  Anxiety - weaned herself off of zoloft last week because she was concerned she was getting palpitations from it.  Anxiety has been ok.  Still using as needed lorazepam.  Migraines- relatively stable.  No recent migraines.  Lab Results  Component Value Date   CHOL 147 08/04/2016   HDL 54.00 08/04/2016   LDLCALC 82 08/04/2016   TRIG 58.0 08/04/2016   CHOLHDL 3 08/04/2016   Lab Results  Component Value Date   CREATININE 0.92 08/04/2016   Lab Results  Component Value Date   WBC 9.2 08/04/2016   HGB 14.2 08/04/2016   HCT 42.2 08/04/2016   MCV 77.8 (L) 08/04/2016   PLT 283.0 08/04/2016   Lab Results  Component Value Date   TSH 0.76 08/04/2016   Lab Results  Component Value Date   ALT 13 08/04/2016   AST 18 08/04/2016   ALKPHOS 49 08/04/2016   BILITOT 0.3 08/04/2016     Current Outpatient Prescriptions on File Prior to Visit  Medication Sig Dispense Refill  . cetirizine (ZYRTEC) 10 MG tablet Take 10 mg by mouth at bedtime.    . cyclobenzaprine (FLEXERIL) 5 MG tablet Take 1 tablet (5 mg total) by mouth 3 (three) times daily as needed for muscle spasms. 30 tablet 1  . ferrous sulfate 325 (65 FE) MG tablet Take 1 tablet (325 mg total) by mouth 2 (two) times daily with a meal. 60 tablet 3  . fluticasone (VERAMYST) 27.5 MCG/SPRAY nasal spray Place 2 sprays into the nose daily. Reported on 12/22/2015    . levalbuterol (XOPENEX HFA) 45 MCG/ACT inhaler INHALE ONE TO TWO PUFFS  INTO LUNGS EVERY 4 HOURS AS NEEDED 15 g 2  . levalbuterol (XOPENEX) 0.63 MG/3ML nebulizer solution Take 0.63 mg by nebulization every 4 (four) hours as needed for wheezing or shortness of breath.    Marland Kitchen LORazepam (ATIVAN) 0.5 MG tablet Take 1 tablet (0.5 mg total) by mouth every 8 (eight) hours as needed for anxiety or sleep. 30 tablet 0  . montelukast (SINGULAIR) 10 MG tablet Take 1 tablet (10 mg total) by mouth at bedtime. 30 tablet 11  . naproxen (NAPROSYN) 500 MG tablet 500 mg BID for 7 days, and then BID PRN 30 tablet 0  . omeprazole (PRILOSEC) 20 MG capsule TAKE ONE CAPSULE BY MOUTH EVERY DAY 30 capsule 5  . propranolol (INDERAL) 20 MG tablet Take 1 tablet (20 mg total) by mouth 3 (three) times daily. 90 tablet 0  . sertraline (ZOLOFT) 25 MG tablet TAKE ONE TABLET BY MOUTH ONCE DAILY 90 tablet 1  . SUMAtriptan (IMITREX) 25 MG tablet Take 2 tablets (50 mg total) by mouth every 2 (two) hours as needed for migraine. 10 tablet 0   No current facility-administered medications on file prior to visit.     Allergies  Allergen Reactions  . Penicillins Hives  . Pseudoephedrine Hcl Palpitations    Past Medical  History:  Diagnosis Date  . Allergic rhinitis, cause unspecified   . Allergy   . Anemia   . Elevated blood pressure reading without diagnosis of hypertension   . Esophageal reflux   . Extrinsic asthma, unspecified   . Leukocytosis, unspecified   . Pain in joint, shoulder region   . Personal history of traumatic fracture   . Unspecified vitamin D deficiency     No past surgical history on file.  Family History  Problem Relation Age of Onset  . Heart disease Father     Social History   Social History  . Marital status: Single    Spouse name: N/A  . Number of children: N/A  . Years of education: N/A   Occupational History  . Not on file.   Social History Main Topics  . Smoking status: Never Smoker  . Smokeless tobacco: Never Used  . Alcohol use No  . Drug use: No    . Sexual activity: Not on file   Other Topics Concern  . Not on file   Social History Narrative  . No narrative on file   The PMH, PSH, Social History, Family History, Medications, and allergies have been reviewed in [facility], and have been updated if relevant.   Review of Systems  Constitutional: Negative.   HENT: Negative.   Eyes: Negative.   Respiratory: Negative.   Cardiovascular: Negative.   Gastrointestinal: Negative.   Endocrine: Negative.   Genitourinary: Negative.   Musculoskeletal: Negative.   Allergic/Immunologic: Negative.   Neurological: Negative.   Hematological: Negative.   Psychiatric/Behavioral: Negative.   All other systems reviewed and are negative.      Objective:    BP 124/80   Pulse 100   Temp 97.8 F (36.6 C) (Oral)   Ht 5' 6.25" (1.683 m)   Wt 165 lb 8 oz (75.1 kg)   SpO2 99%   BMI 26.51 kg/m    Physical Exam  General:  Well-developed,well-nourished,in no acute distress; alert,appropriate and cooperative throughout examination Head:  normocephalic and atraumatic.   Eyes:  vision grossly intact, PERRL Ears:  R ear normal and L ear normal externally, TMs clear bilaterally Nose:  no external deformity.   Mouth:  good dentition.   Neck:  No deformities, masses, or tenderness noted. Breasts:  No mass, nodules, thickening, tenderness, bulging, retraction, inflamation, nipple discharge or skin changes noted.   Lungs:  Normal respiratory effort, chest expands symmetrically. Lungs are clear to auscultation, no crackles or wheezes. Heart:  Normal rate and regular rhythm. S1 and S2 normal without gallop, murmur, click, rub or other extra sounds. Abdomen:  Bowel sounds positive,abdomen soft and non-tender without masses, organomegaly or hernias noted. Msk:  No deformity or scoliosis noted of thoracic or lumbar spine.   Extremities:  No clubbing, cyanosis, edema, or deformity noted with normal full range of motion of all joints.   Neurologic:  alert &  oriented X3 and gait normal.   Skin:  Intact without suspicious lesions or rashes Cervical Nodes:  No lymphadenopathy noted Axillary Nodes:  No palpable lymphadenopathy Psych:  Cognition and judgment appear intact. Alert and cooperative with normal attention span and concentration. No apparent delusions, illusions, hallucinations        Assessment & Plan:   Encounter for gynecological examination without abnormal finding  Well woman exam  Anxiety state  Vitamin D deficiency  Sinusitis, unspecified chronicity, unspecified location  Migraine without status migrainosus, not intractable, unspecified migraine type No Follow-up on file.

## 2016-08-07 NOTE — Assessment & Plan Note (Signed)
Reviewed preventive care protocols, scheduled due services, and updated immunizations Discussed nutrition, exercise, diet, and healthy lifestyle.  

## 2016-09-29 ENCOUNTER — Encounter: Payer: Self-pay | Admitting: Family Medicine

## 2016-09-29 ENCOUNTER — Other Ambulatory Visit: Payer: Self-pay | Admitting: Family Medicine

## 2016-09-29 MED ORDER — MONTELUKAST SODIUM 10 MG PO TABS: 10.0000 mg | ORAL_TABLET | Freq: Every day | ORAL | 1 refills | Status: DC

## 2016-10-22 ENCOUNTER — Ambulatory Visit (INDEPENDENT_AMBULATORY_CARE_PROVIDER_SITE_OTHER): Payer: Managed Care, Other (non HMO) | Admitting: Family Medicine

## 2016-10-22 VITALS — BP 120/80 | HR 97 | Ht 66.25 in | Wt 175.0 lb

## 2016-10-22 DIAGNOSIS — H1013 Acute atopic conjunctivitis, bilateral: Secondary | ICD-10-CM

## 2016-10-22 MED ORDER — OLOPATADINE HCL 0.1 % OP SOLN: 1.0000 [drp] | Freq: Two times a day (BID) | OPHTHALMIC | 5 refills | Status: DC

## 2016-10-22 NOTE — Progress Notes (Signed)
Subjective:     Patient ID: Mackenzie Shea, female   DOB: July 15, 1981, 36 y.o.   MRN: [id number]  HPI   Patient is seen with about 4 week history of bilateral eye clear drainage and itching. 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 . No contact use. She has watery discharge infrequent itching. No blurred vision. No eye pain. Minimal redness. She's tried over-the-counter Claritin as well as over-the-counter vasoconstrictors without much improvement.  Past Medical History:  Diagnosis Date  . Allergic rhinitis, cause unspecified   . Allergy   . Anemia   . Elevated blood pressure reading without diagnosis of hypertension   . Esophageal reflux   . Extrinsic asthma, unspecified   . Leukocytosis, unspecified   . Pain in joint, shoulder region   . Personal history of traumatic fracture   . Unspecified vitamin D deficiency    No past surgical history on file.  reports that she has never smoked. She has never used smokeless tobacco. She reports that she does not drink alcohol or use drugs. family history includes Heart disease in her father. Allergies  Allergen Reactions  . Penicillins Hives  . Pseudoephedrine Hcl Palpitations     Review of Systems  Eyes: Positive for redness and itching. Negative for pain and visual disturbance.       Objective:   Physical Exam  Constitutional: She appears well-developed and well-nourished.  Eyes: Conjunctivae and EOM are normal. Pupils are equal, round, and reactive to light.  Minimal clear drainage from both eyes. Pupils equal reactive to light  Cardiovascular: Normal rate and regular rhythm.        Assessment:     Allergic conjunctivitis    Plan:     -Avoid scratching as much as possible -Consider over-the-counter Xyzal one daily -Patanol eyedrops 1 drop each eye twice daily  Eulas Post MD [facility]

## 2016-10-22 NOTE — Patient Instructions (Signed)
Allergic Conjunctivitis, Adult Allergic conjunctivitis is inflammation of the clear membrane that covers the white part of your eye and the inner surface of your eyelid (conjunctiva). The inflammation is caused by allergies. The blood vessels in the conjunctiva become inflamed and this causes the eyes to become red or pink. The eyes often feel itchy. Allergic conjunctivitis cannot be spread from one person to another person (is not contagious). What are the causes? This condition is caused by an allergic reaction. Common causes of an allergic reaction (allergens) include:  Outdoor allergens, such as:  Pollen.  Grass and weeds.  Mold spores.  Indoor allergens, such as:  Dust.  Smoke.  Mold.  Pet dander.  Animal hair. What increases the risk? You may be more likely to develop this condition if you have a family history of allergies, such as:  Allergic rhinitis.  Bronchial asthma.  Atopic dermatitis. What are the signs or symptoms? Symptoms of this condition include eyes that are:  Itchy.  Red.  Watery.  Puffy. Your eyes may also:  Sting or burn.  Have clear drainage coming from them. How is this diagnosed? This condition may be diagnosed by medical history and physical exam. If you have drainage from your eyes, it may be tested to rule out other causes of conjunctivitis. You may also need to see a health care provider who specializes in treating allergies (allergist) or eye conditions (ophthalmologist) for tests to confirm the diagnosis. You may have:  Skin tests to see which allergens are causing your symptoms. These tests involve pricking the skin with a tiny needle and exposing the skin to small amounts of potential allergens to see if your skin reacts.  Blood tests.  Tissue scrapings from your eyelid. These will be examined under a microscope. How is this treated? Treatments for this condition may include:  Cold cloths (compresses) to soothe itching and  swelling.  Washing the face to remove allergens.  Eye drops. These may be prescription or over-the-counter. There are several different types. You may need to try different types to see which one works best for you. Your may need:  Eye drops that block the allergic reaction (antihistamine).  Eye drops that reduce swelling and irritation (anti-inflammatory).  Steroid eye drops to lessen a severe reaction (vernal conjunctivitis).  Oral antihistamine medicines to reduce your allergic reaction. You may need these if eye drops do not help or are difficult to use. Follow these instructions at home:  Avoid known allergens whenever possible.  Take or apply over-the-counter and prescription medicines only as told by your health care provider. These include any eye drops.  Apply a cool, clean washcloth to your eye for 10-20 minutes, 3-4 times a day.  Do not touch or rub your eyes.  Do not wear contact lenses until the inflammation is gone. Wear glasses instead.  Do not wear eye makeup until the inflammation is gone.  Keep all follow-up visits as told by your health care provider. This is important. Contact a health care provider if:  Your symptoms get worse or do not improve with treatment.  You have mild eye pain.  You have sensitivity to light.  You have spots or blisters on your eyes.  You have pus draining from your eye.  You have a fever. Get help right away if:  You have redness, swelling, or other symptoms in only one eye.  Your vision is blurred or you have vision changes.  You have severe eye pain. This information is   not intended to replace advice given to you by your health care provider. Make sure you discuss any questions you have with your health care provider. Document Released: 11/08/2002 Document Revised: 04/16/2016 Document Reviewed: 02/29/2016 Elsevier Interactive Patient Education  2017 [place].  May also consider OTC Xyzal one daily.

## 2016-10-22 NOTE — Progress Notes (Signed)
Pre visit review using our clinic review tool, if applicable. No additional management support is needed unless otherwise documented below in the visit note. 

## 2016-11-22 ENCOUNTER — Encounter: Payer: Self-pay | Admitting: Family Medicine

## 2016-11-27 ENCOUNTER — Encounter: Payer: Self-pay | Admitting: Family Medicine

## 2016-11-28 ENCOUNTER — Other Ambulatory Visit: Payer: Self-pay | Admitting: Family Medicine

## 2016-11-28 MED ORDER — SUMATRIPTAN SUCCINATE 25 MG PO TABS: 50.0000 mg | ORAL_TABLET | ORAL | 2 refills | Status: DC | PRN

## 2016-11-28 NOTE — Telephone Encounter (Signed)
Last office visit 08/07/16.  Last refilled Lorazepam 07/21/16 for #30 with no refills  Ok to refill?

## 2016-12-01 ENCOUNTER — Other Ambulatory Visit: Payer: Self-pay | Admitting: Family Medicine

## 2016-12-01 DIAGNOSIS — R1013 Epigastric pain: Secondary | ICD-10-CM

## 2016-12-01 MED ORDER — PANTOPRAZOLE SODIUM 40 MG PO TBEC: 40.0000 mg | DELAYED_RELEASE_TABLET | Freq: Every day | ORAL | 3 refills | Status: DC

## 2016-12-01 MED ORDER — LORAZEPAM 0.5 MG PO TABS: 0.5000 mg | ORAL_TABLET | Freq: Three times a day (TID) | ORAL | 0 refills | Status: DC | PRN

## 2016-12-01 NOTE — Telephone Encounter (Signed)
Dr Deborra Medina okay to  Refill?

## 2016-12-01 NOTE — Telephone Encounter (Signed)
Lorazepam called into [facility] ([facility]), [place] - [address] Phone: [phone]

## 2016-12-02 NOTE — Telephone Encounter (Signed)
Alprazolam called into [facility] ([facility]), [place] - [address] Phone: [phone].

## 2016-12-09 ENCOUNTER — Encounter: Payer: Self-pay | Admitting: Family Medicine

## 2016-12-10 ENCOUNTER — Ambulatory Visit (INDEPENDENT_AMBULATORY_CARE_PROVIDER_SITE_OTHER): Payer: Managed Care, Other (non HMO) | Admitting: Cardiovascular Disease

## 2016-12-10 ENCOUNTER — Encounter: Payer: Self-pay | Admitting: Cardiovascular Disease

## 2016-12-10 VITALS — BP 142/78 | HR 112 | Ht 66.0 in | Wt 173.5 lb

## 2016-12-10 DIAGNOSIS — I10 Essential (primary) hypertension: Secondary | ICD-10-CM | POA: Diagnosis not present

## 2016-12-10 DIAGNOSIS — F411 Generalized anxiety disorder: Secondary | ICD-10-CM

## 2016-12-10 DIAGNOSIS — R000 Tachycardia, unspecified: Secondary | ICD-10-CM

## 2016-12-10 DIAGNOSIS — R002 Palpitations: Secondary | ICD-10-CM | POA: Diagnosis not present

## 2016-12-10 MED ORDER — PROPRANOLOL HCL 10 MG PO TABS: 10.0000 mg | ORAL_TABLET | Freq: Three times a day (TID) | ORAL | 6 refills | Status: DC | PRN

## 2016-12-10 NOTE — Progress Notes (Signed)
Cardiology Office Note  Date:  12/10/2016   ID:  Mackenzie Shea Jun 13, 1981, MRN [id number]  PCP:  Mackenzie Norris, MD   Chief Complaint  Patient presents with  . OTHER    LS 2012 c/o heart palpitations/skipped beat. Pt mentioned under alot of stress currently recently adopted four children. Meds reviewed verbally with pt.    HPI:  [name] is a very pleasant 36 year old woman Today history of anxiety, migraines, uterine fibroids who recently adopted 4 children ages 10, 12, 74,  Previously seen in 2012 for  Tachycardia, Was given propranolol At that time who presents by referral from Dr. 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 Had lightheadedness, symptoms seem to persist for long time Developed Vertigo in 12/2015, Required treatment, symptoms lasted 1 month She reported that her Anxiety Was getting worse and was started on Zoloft Started having Migraines After she Adopted 4 children From one family, approximately 3 years ago, stress at home has been significant. 36 year old girl is going through adolescence She weaned herself off Zoloft as she felt this might be having side effects including tachycardia  Now with heartburn, tried PPI Periodic asthma Sometimes with poor sleep hygiene  She is concerned about her heart rate typically runs 90, more recently 110 or higher She has palpations at night, sometimes feels like it is stopping and starting. Does not feel these irregular beats during the daytime as much. Reports that she was previously on propranolol, does not have a new prescription. This seemed to work okay before but she did not needed for very long.  Reports that she does take Ativan as needed, this has helped to relieve some of her stress.  EKG today shows Sinus tachycardia with rate 121 beats per minute with no significant ST or T wave  changes   PMH:   has a past medical history of Allergic rhinitis, cause unspecified; Allergy; Anemia; Elevated blood pressure reading without diagnosis of hypertension; Esophageal reflux; Extrinsic asthma, unspecified; Leukocytosis, unspecified; Pain in joint, shoulder region; Personal history of traumatic fracture; and Unspecified vitamin D deficiency.  PSH:   History reviewed. No pertinent surgical history.  Current Outpatient Prescriptions  Medication Sig Dispense Refill  . cetirizine (ZYRTEC) 10 MG tablet Take 10 mg by mouth at bedtime.    . Cholecalciferol (VITAMIN D) 2000 units CAPS Take by mouth as needed.     . cyclobenzaprine (FLEXERIL) 5 MG tablet Take 1 tablet (5 mg total) by mouth 3 (three) times daily as needed for muscle spasms. 30 tablet 1  . esomeprazole (NEXIUM) 20 MG capsule Take 20 mg by mouth as needed.    . fluticasone (VERAMYST) 27.5 MCG/SPRAY nasal spray Place 2 sprays into the nose daily. Reported on 12/22/2015    . levalbuterol (XOPENEX HFA) 45 MCG/ACT inhaler INHALE ONE TO TWO PUFFS INTO LUNGS EVERY 4 HOURS AS NEEDED 15 g 2  . levalbuterol (XOPENEX) 0.63 MG/3ML nebulizer solution Take 0.63 mg by nebulization every 4 (four) hours as needed for wheezing or shortness of breath.    Marland Kitchen LORazepam (ATIVAN) 0.5 MG tablet TAKE ONE TABLET BY MOUTH EVERY 8 HOURS AS NEEDED FOR ANXIETY FOR SLEEP 30 tablet 0  . montelukast (SINGULAIR) 10 MG tablet Take 1 tablet (10 mg total) by mouth at bedtime. 90 tablet 1  . olopatadine (PATANOL) 0.1 % ophthalmic solution Place 1 drop into both eyes  2 (two) times daily. 5 mL 5  . omeprazole (PRILOSEC) 20 MG capsule TAKE ONE CAPSULE BY MOUTH EVERY DAY (Patient taking differently: TAKE ONE CAPSULE BY MOUTH AS NEEDED.) 30 capsule 5  . pantoprazole (PROTONIX) 40 MG tablet Take 1 tablet (40 mg total) by mouth daily. 30 tablet 3  . ranitidine (ZANTAC) 150 MG tablet Take 150 mg by mouth 2 (two) times daily.    . sertraline (ZOLOFT) 25 MG tablet TAKE ONE  TABLET BY MOUTH ONCE DAILY (Patient taking differently: TAKE ONE TABLET BY MOUTH ONCE DAILAS NEEDED.) 90 tablet 1  . SUMAtriptan (IMITREX) 25 MG tablet Take 2 tablets (50 mg total) by mouth every 2 (two) hours as needed for migraine. 10 tablet 2  . nebivolol (BYSTOLIC) 5 MG tablet Take 1 tablet (5 mg total) by mouth daily. 30 tablet   . propranolol (INDERAL) 10 MG tablet Take 1 tablet (10 mg total) by mouth 3 (three) times daily as needed. 90 tablet 6   No current facility-administered medications for this visit.      Allergies:   Penicillins and Pseudoephedrine hcl   Social History:  The patient  reports that she has never smoked. She has never used smokeless tobacco. She reports that she does not drink alcohol or use drugs.   Family History:   family history includes Heart disease in her father.    Review of Systems: Review of Systems  Constitutional: Negative.   Respiratory: Negative.   Cardiovascular: Positive for palpitations.       Tachycardia  Gastrointestinal: Negative.   Musculoskeletal: Negative.   Neurological: Negative.   Psychiatric/Behavioral: The patient is nervous/anxious.   All other systems reviewed and are negative.    PHYSICAL EXAM: VS:  BP (!) 142/78 (BP Location: Left Arm, Patient Position: Sitting, Cuff Size: Normal)   Pulse (!) 112   Ht 5\' 6"  (1.676 m)   Wt 173 lb 8 oz (78.7 kg)   BMI 28.00 kg/m  , BMI Body mass index is 28 kg/m. GEN: Well nourished, well developed, in no acute distress  HEENT: normal  Neck: no JVD, carotid bruits, or masses Cardiac: RRR; no murmurs, rubs, or gallops,no edema  Respiratory:  clear to auscultation bilaterally, normal work of breathing GI: soft, nontender, nondistended, + BS MS: no deformity or atrophy  Skin: warm and dry, no rash Neuro:  Strength and sensation are intact Psych: euthymic mood, full affect   Recent Labs: 08/04/2016: ALT 13; BUN 14; Creatinine, Ser 0.92; Hemoglobin 14.2; Platelets 283.0; Potassium  4.1; Sodium 139; TSH 0.76    Lipid Panel Lab Results  Component Value Date   CHOL 147 08/04/2016   HDL 54.00 08/04/2016   LDLCALC 82 08/04/2016   TRIG 58.0 08/04/2016      Wt Readings from Last 3 Encounters:  12/10/16 173 lb 8 oz (78.7 kg)  10/22/16 175 lb (79.4 kg)  08/07/16 165 lb 8 oz (75.1 kg)       ASSESSMENT AND PLAN: Essential hypertension Given her high blood pressure, tachycardia, recommended that she start bystolic 5 mg daily. We may need to titrate upwards To 10 mg for rate control.   Tachycardia - Plan: EKG 12-Lead Long discussion concerning various treatment options for her tachycardia Long history of chronic sinus tachycardia likely exacerbated by anxiety. Discussed various treatment options for her. She could take beta blocker as needed for symptomatic palpitations, with propranolol. Other option would be to take beta blocker daily With bystolic 5 mg up to 10 mg  as detailed above. She potentially could even take bystolic 5 mg daily with propranolol for breakthrough  Anxiety state She previously stopped sertraline Given significant anxiety, she may need all medication Significant home stress With 4 young children  Palpitations Likely having APCs or PVCs. Discussed this with her, likely of little significance. Solitary Abnormal beats may improve with low-dose beta blocker    Total encounter time more than 60 minutes  Greater than 50% was spent in counseling and coordination of care with the patient  Ms. Hazell was seen in consultation for Dr. Deborra Shea and will be referred back for ongoing care of the issues detailed above   Disposition:   F/U  As needed   Orders Placed This Encounter  Procedures  . EKG 12-Lead     Signed, Esmond Plants, M.D., Ph.D. 12/10/2016  [place], [place]

## 2016-12-10 NOTE — Patient Instructions (Addendum)
Medication Instructions:   Please try bystolic 5 up to 10 mg daily for tachycardia Take propranolol 10 mg up to 20 mg as needed for breakthrough tachycardia   Labwork:  No new labs needed  Testing/Procedures:  No further testing at this time   I recommend watching educational videos on topics of interest to you at:       [email or web address].goemmi.com  Enter code: HEARTCARE    Follow-Up: It was a pleasure seeing you in the office today. Please call us if you have new issues that need to be addressed before your next appt.  [phone]  Your physician wants you to follow-up in:  as needed  If you need a refill on your cardiac medications before your next appointment, please call your pharmacy.

## 2016-12-12 ENCOUNTER — Telehealth: Payer: Self-pay

## 2016-12-12 NOTE — Telephone Encounter (Signed)
Pt notified as instructed and pt does not want to go to ED or UC and does not want to go all weekend without some relief. Advised pt will need to go to [facility] or ED then. Pt scheduled 30' appt with Dr Deborra Medina on 12/15/16 at 3:45 and pt advised if pain or swelling worsened to go to [facility] or ED. Pt voiced understanding. FYI to Dr Deborra Medina.

## 2016-12-12 NOTE — Telephone Encounter (Signed)
I cannot make clear recommendations  Without pt being seen. If severe pain .Marland Kitchen Go to ER or urgent care... If milder make appt to see PCP early next week.

## 2016-12-12 NOTE — Telephone Encounter (Signed)
Pt left v/m; pt could not rest last night with abd distention and pain; cannot move any gas and when burps taste like acid; pt has tried to eat crackers today and pt still has abd distention, pain and cramping. Pt not sure can wait until 12/22/16 for GI consult and wants to know if could get sooner GI appt at another office. Pt request cb with what to do.

## 2016-12-15 ENCOUNTER — Telehealth: Payer: Self-pay | Admitting: Family Medicine

## 2016-12-15 ENCOUNTER — Encounter: Payer: Self-pay | Admitting: Family Medicine

## 2016-12-15 ENCOUNTER — Other Ambulatory Visit: Payer: Self-pay | Admitting: Family Medicine

## 2016-12-15 ENCOUNTER — Ambulatory Visit: Payer: Managed Care, Other (non HMO) | Admitting: Family Medicine

## 2016-12-15 MED ORDER — CITALOPRAM HYDROBROMIDE 20 MG PO TABS: ORAL_TABLET | ORAL | 6 refills | Status: DC

## 2016-12-15 NOTE — Progress Notes (Signed)
Talked to pt.  Saw Dr. Rockey Situ and he suggested starting Celexa or lexapro.  Discussed with pt- will start Celexa- eRx sent.  She will keep me updated.

## 2016-12-18 ENCOUNTER — Other Ambulatory Visit: Payer: Self-pay | Admitting: Family Medicine

## 2016-12-19 MED ORDER — LORAZEPAM 0.5 MG PO TABS: 0.5000 mg | ORAL_TABLET | Freq: Two times a day (BID) | ORAL | 0 refills | Status: DC

## 2016-12-19 NOTE — Telephone Encounter (Signed)
rx called in to [facility]

## 2016-12-19 NOTE — Telephone Encounter (Signed)
Pt called to verify rx was going to be called in; shannon said Dr Deborra Medina had just let her know about refill and Larene Beach was going to call in to [facility]. Pt will ck with pharmacy.

## 2016-12-22 ENCOUNTER — Ambulatory Visit: Payer: Managed Care, Other (non HMO) | Admitting: Nurse Practitioner

## 2016-12-25 ENCOUNTER — Ambulatory Visit: Payer: Managed Care, Other (non HMO) | Admitting: Cardiovascular Disease

## 2016-12-30 ENCOUNTER — Ambulatory Visit: Payer: Managed Care, Other (non HMO) | Admitting: Internal Medicine

## 2016-12-31 ENCOUNTER — Ambulatory Visit: Payer: Managed Care, Other (non HMO) | Admitting: Gastroenterology

## 2017-01-01 NOTE — Telephone Encounter (Signed)
error 

## 2017-01-12 ENCOUNTER — Other Ambulatory Visit: Payer: Self-pay | Admitting: Family Medicine

## 2017-01-12 ENCOUNTER — Encounter: Payer: Self-pay | Admitting: Family Medicine

## 2017-01-12 ENCOUNTER — Other Ambulatory Visit: Payer: Self-pay

## 2017-01-12 MED ORDER — LEVALBUTEROL TARTRATE 45 MCG/ACT IN AERO: INHALATION_SPRAY | RESPIRATORY_TRACT | 2 refills | Status: DC

## 2017-01-12 NOTE — Telephone Encounter (Signed)
Ok to send? There's allergy/contradiction/

## 2017-01-13 ENCOUNTER — Encounter: Payer: Self-pay | Admitting: Nurse Practitioner

## 2017-01-13 ENCOUNTER — Ambulatory Visit (INDEPENDENT_AMBULATORY_CARE_PROVIDER_SITE_OTHER): Payer: Managed Care, Other (non HMO) | Admitting: Nurse Practitioner

## 2017-01-13 VITALS — BP 138/78 | HR 89 | Ht 66.25 in | Wt 171.0 lb

## 2017-01-13 DIAGNOSIS — F419 Anxiety disorder, unspecified: Secondary | ICD-10-CM | POA: Diagnosis not present

## 2017-01-13 DIAGNOSIS — K219 Gastro-esophageal reflux disease without esophagitis: Secondary | ICD-10-CM | POA: Diagnosis not present

## 2017-01-13 NOTE — Progress Notes (Signed)
HPI: Patient is a 36 year old female referred by PCP Dr. Arnette Norris for epigastric pain. A couple of days ago patient developed abdominal distention / discomfort. She states the symptoms were transient and is really more interested in evaluation of some throat problems she has been experiencing. Mackenzie Shea gives a history of Intermittent GERD symptoms with heartburn, regurgitation and globus over last three years. Episodes generally last a couple of weeks at at time and respond nicely to a short course of PPI therapy.     In December patient developed recurrent GERD symptoms   She tried Zantac and Prilosec without much relief. After starting Protonix all of her GERD symptoms resolved except for the globus. She started Fluticsome for sinus / allergy problems.  Though sinus problems have resolved she still has the globus.  Patient wonders if globus may be related to stress  / anxiety.  She and her husband adopted 4 kids, one of which has some behavioral struggles, Patient was taking zoloft but of irregular heartbeat. Cardiology saw her last month and started Bysolic. She feels much better, seldom notices irregular heartbeat now. Marland Kitchen.She has been taking Ativan for a couple of months with plans to transition to Celexa soon. She has noticed that the throat clearing is less with the ativan. .     Patient gives a remote hx of IBS with constipation and bloating. After eliminating beef her bowel symptoms resolved. Her bowels are regular. No other GI complaints.    Past Medical History:  Diagnosis Date  . Allergic rhinitis, cause unspecified   . Allergy   . Anemia   . Elevated blood pressure reading without diagnosis of hypertension   . Esophageal reflux   . Extrinsic asthma, unspecified   . Leukocytosis, unspecified   . Pain in joint, shoulder region   . Personal history of traumatic fracture   . Unspecified vitamin D deficiency     History reviewed. No pertinent surgical history. Family History    Problem Relation Age of Onset  . Heart disease Father    Social History  Substance Use Topics  . Smoking status: Never Smoker  . Smokeless tobacco: Never Used  . Alcohol use No   Current Outpatient Prescriptions  Medication Sig Dispense Refill  . cetirizine (ZYRTEC) 10 MG tablet Take 10 mg by mouth at bedtime.    . Cholecalciferol (VITAMIN D) 2000 units CAPS Take by mouth as needed.     . citalopram (CELEXA) 20 MG tablet 1/2 tab by mouth daily x 1 week and then proceed to 1 tablet daily if well tolerated. 30 tablet 6  . cyclobenzaprine (FLEXERIL) 5 MG tablet Take 1 tablet (5 mg total) by mouth 3 (three) times daily as needed for muscle spasms. 30 tablet 1  . fluticasone (VERAMYST) 27.5 MCG/SPRAY nasal spray Place 2 sprays into the nose daily. Reported on 12/22/2015    . levalbuterol (XOPENEX HFA) 45 MCG/ACT inhaler INHALE ONE TO TWO PUFFS INTO LUNGS EVERY 4 HOURS AS NEEDED 15 g 2  . levalbuterol (XOPENEX) 0.63 MG/3ML nebulizer solution Take 0.63 mg by nebulization every 4 (four) hours as needed for wheezing or shortness of breath.    Marland Kitchen LORazepam (ATIVAN) 0.5 MG tablet Take 1 tablet (0.5 mg total) by mouth 2 (two) times daily. 60 tablet 0  . montelukast (SINGULAIR) 10 MG tablet Take 1 tablet (10 mg total) by mouth at bedtime. 90 tablet 1  . nebivolol (BYSTOLIC) 5 MG tablet Take 1 tablet (5  mg total) by mouth daily. 30 tablet   . olopatadine (PATANOL) 0.1 % ophthalmic solution Place 1 drop into both eyes 2 (two) times daily. 5 mL 5  . pantoprazole (PROTONIX) 40 MG tablet Take 1 tablet (40 mg total) by mouth daily. 30 tablet 3  . propranolol (INDERAL) 10 MG tablet Take 1 tablet (10 mg total) by mouth 3 (three) times daily as needed. 90 tablet 6  . ranitidine (ZANTAC) 150 MG tablet Take 150 mg by mouth 2 (two) times daily.    . SUMAtriptan (IMITREX) 25 MG tablet Take 2 tablets (50 mg total) by mouth every 2 (two) hours as needed for migraine. 10 tablet 2   No current facility-administered  medications for this visit.    Allergies  Allergen Reactions  . Penicillins Hives  . Pseudoephedrine Hcl Palpitations     Review of Systems: All systems reviewed and negative except where noted in HPI.   Physical Exam: BP 138/78 (BP Location: Right Arm, Patient Position: Sitting, Cuff Size: Normal)   Pulse 89   Ht 5' 6.25" (1.683 m)   Wt 171 lb (77.6 kg)   BMI 27.39 kg/m  Constitutional:  Well-developed, black female in no acute distress. Psychiatric: Normal mood and affect. Behavior is normal. EENT: . Conjunctivae are normal. No scleral icterus. Neck supple.  Cardiovascular: Normal rate, regular rhythm.  Pulmonary/chest: Effort normal and breath sounds normal. No wheezing, rales or rhonchi. Abdominal: Soft, nondistended, nontender. Bowel sounds active throughout. There are no masses palpable. No hepatomegaly. Extremities: no edema Lymphadenopathy: No cervical adenopathy noted. Neurological: Alert and oriented to person place and time. Skin: Skin is warm and dry. No rashes noted.   ASSESSMENT AND PLAN:  36 yo female with at three year history of GERD symptoms occuring a couple of times a year. Historically symptoms have subsided within two weeks of starting a PPI.but she has been struggling with globus sensation since December (despite resolution of other GERD symptoms). Her allergies / sinus drainage is under control with meds and not likely contributing factor. Patient wonders if stress / anxiety are contributing to her symptoms, especially since they have been less noticeable on Ativan. She is supposed to be transitioning off Ativan to Celexa soon -We discussed EGD vrs watch and wait. She would like to see what happens after starting Celexa. Given absence of alarm symptoms I think it is reasonable to give it more time. If globus persists or new symptoms develop then EGD is next step. Patient will call us in a few weeks with a condition update.    Tye Savoy, NP   01/13/2017, 2:11 PM   Lucille Passy, MD

## 2017-01-13 NOTE — Patient Instructions (Addendum)
If you are age 37 or older, your body mass index should be between 23-30. Your Body mass index is 27.39 kg/m. If this is out of the aforementioned range listed, please consider follow up with your Primary Care Provider.  If you are age 18 or younger, your body mass index should be between 19-25. Your Body mass index is 27.39 kg/m. If this is out of the aformentioned range listed, please consider follow up with your Primary Care Provider.   Continue Protonix in the morning and Zantac at bedtime.  Call in two months with an update.  Thank you for choosing me and [facility].  Tye Savoy, NP

## 2017-01-29 NOTE — Progress Notes (Signed)
Reviewed and agree with documentation and assessment and plan. K. Veena Onyx Schirmer , MD   

## 2017-02-03 ENCOUNTER — Other Ambulatory Visit: Payer: Self-pay | Admitting: Family Medicine

## 2017-02-04 ENCOUNTER — Other Ambulatory Visit: Payer: Self-pay | Admitting: Family Medicine

## 2017-02-04 MED ORDER — LORAZEPAM 0.5 MG PO TABS: 0.5000 mg | ORAL_TABLET | Freq: Two times a day (BID) | ORAL | 0 refills | Status: DC

## 2017-02-04 NOTE — Telephone Encounter (Signed)
Last refill 12/19/16 #60, last OV 08/07/16

## 2017-02-04 NOTE — Telephone Encounter (Signed)
Called in to [place], [place]: [phone]

## 2017-02-04 NOTE — Telephone Encounter (Signed)
Called in already

## 2017-03-06 ENCOUNTER — Other Ambulatory Visit: Payer: Self-pay | Admitting: Family Medicine

## 2017-03-06 ENCOUNTER — Encounter: Payer: Self-pay | Admitting: Cardiovascular Disease

## 2017-03-06 MED ORDER — NEBIVOLOL HCL 5 MG PO TABS
5.0000 mg | ORAL_TABLET | Freq: Every day | ORAL | 5 refills | Status: DC
Start: 2017-03-06 — End: 2017-07-17

## 2017-03-16 ENCOUNTER — Encounter: Payer: Self-pay | Admitting: Nurse Practitioner

## 2017-04-16 ENCOUNTER — Other Ambulatory Visit: Payer: Self-pay | Admitting: Family Medicine

## 2017-04-16 ENCOUNTER — Encounter: Payer: Self-pay | Admitting: Family Medicine

## 2017-04-20 ENCOUNTER — Encounter (HOSPITAL_COMMUNITY): Payer: Self-pay

## 2017-04-20 ENCOUNTER — Encounter (HOSPITAL_COMMUNITY)
Admission: RE | Admit: 2017-04-20 | Discharge: 2017-04-20 | Disposition: A | Discharge status: Home / Self Care | Payer: 59 | Source: Ambulatory Visit | Attending: Obstetrics and Gynecology | Admitting: Obstetrics and Gynecology

## 2017-04-20 DIAGNOSIS — D259 Leiomyoma of uterus, unspecified: Secondary | ICD-10-CM | POA: Insufficient documentation

## 2017-04-20 DIAGNOSIS — Z01812 Encounter for preprocedural laboratory examination: Secondary | ICD-10-CM | POA: Diagnosis not present

## 2017-04-20 HISTORY — DX: Anxiety disorder, unspecified: F41.9

## 2017-04-20 HISTORY — DX: Tachycardia, unspecified: R00.0

## 2017-04-20 LAB — BASIC METABOLIC PANEL
ANION GAP: 9 (ref 5–15)
BUN: 12 mg/dL (ref 6–20)
CALCIUM: 9.5 mg/dL (ref 8.9–10.3)
CO2: 24 mmol/L (ref 22–32)
Chloride: 102 mmol/L (ref 101–111)
Creatinine, Ser: 0.89 mg/dL (ref 0.44–1.00)
GFR calc Af Amer: >60 mL/min (ref >60)
GLUCOSE: 86 mg/dL (ref 65–99)
Potassium: 3.9 mmol/L (ref 3.5–5.1)
Sodium: 135 mmol/L (ref 135–145)

## 2017-04-20 LAB — ABO/RH: ABO/RH(D): B POS

## 2017-04-20 LAB — CBC
HEMATOCRIT: 42.8 % (ref 36.0–46.0)
Hemoglobin: 14.4 g/dL (ref 12.0–15.0)
MCH: 26.2 pg (ref 26.0–34.0)
MCHC: 33.6 g/dL (ref 30.0–36.0)
MCV: 77.8 fL — AB (ref 78.0–100.0)
PLATELETS: 314 10*3/uL (ref 150–400)
RBC: 5.5 MIL/uL — AB (ref 3.87–5.11)
RDW: 13.4 % (ref 11.5–15.5)
WBC: 11 10*3/uL — AB (ref 4.0–10.5)

## 2017-04-20 LAB — TYPE AND SCREEN
ABO/RH(D): B POS
Antibody Screen: NEGATIVE

## 2017-04-20 NOTE — Patient Instructions (Addendum)
Your procedure is scheduled on:  Thursday, Aug. 30, 2018  Enter through the [employer] [facility] at:  6:00 AM  Pick up the phone at the desk and dial [phone].  Call this number if you have problems the morning of surgery: [phone].  Remember: Do NOT eat food or drink after:  Midnight Wednesday  Take these medicines the morning of surgery with a SIP OF WATER:  Celexa, Bystolic, Lorazepam if needed  Bring Asthma Inhaler day of surgery  Stop ALL herbal medications at this time  Do NOT smoke the day of surgery.  Do NOT wear jewelry (body piercing), metal hair clips/bobby pins, make-up, artifical eyelashes or nail polish. Do NOT wear lotions, powders, or perfumes.  You may wear deodorant. Do NOT shave for 48 hours prior to surgery. Do NOT bring valuables to the hospital. Contacts, dentures, or bridgework may not be worn into surgery.  Leave suitcase in car.  After surgery it may be brought to your room.  For patients admitted to the hospital, checkout time is 11:00 AM the day of discharge.  Bring a copy of your healthcare power of attorney and living will documents.

## 2017-04-21 ENCOUNTER — Encounter: Payer: Self-pay | Admitting: Family Medicine

## 2017-04-21 ENCOUNTER — Ambulatory Visit (INDEPENDENT_AMBULATORY_CARE_PROVIDER_SITE_OTHER): Payer: Managed Care, Other (non HMO) | Admitting: Family Medicine

## 2017-04-21 VITALS — BP 108/72 | HR 80 | Temp 97.4°F | Wt 166.0 lb

## 2017-04-21 DIAGNOSIS — R3915 Urgency of urination: Secondary | ICD-10-CM | POA: Diagnosis not present

## 2017-04-21 LAB — POC URINALSYSI DIPSTICK (AUTOMATED)
BILIRUBIN UA: NEGATIVE
GLUCOSE UA: NEGATIVE
Ketones, UA: NEGATIVE
LEUKOCYTES UA: NEGATIVE
NITRITE UA: NEGATIVE
Protein, UA: NEGATIVE
RBC UA: NEGATIVE
Spec Grav, UA: >=1.03 — AB (ref 1.010–1.025)
UROBILINOGEN UA: 0.2 U/dL
pH, UA: 6 (ref 5.0–8.0)

## 2017-04-21 NOTE — Progress Notes (Signed)
SUBJECTIVE: Mackenzie Shea is a 36 y.o. female who complains of urinary frequency, urgency x 14 days, without flank pain, fever, chills, or abnormal vaginal discharge or bleeding.   Fibroids are growing, scheduled for hysterectomy next week. Current Outpatient Prescriptions on File Prior to Visit  Medication Sig Dispense Refill  . cetirizine (ZYRTEC) 10 MG tablet Take 10 mg by mouth at bedtime as needed for allergies.     . citalopram (CELEXA) 20 MG tablet 1/2 tab by mouth daily x 1 week and then proceed to 1 tablet daily if well tolerated. (Patient taking differently: Take 20 mg by mouth daily. ) 30 tablet 6  . fluticasone (VERAMYST) 27.5 MCG/SPRAY nasal spray Place 2 sprays into the nose daily as needed. Reported on 12/22/2015    . levalbuterol (XOPENEX HFA) 45 MCG/ACT inhaler INHALE ONE TO TWO PUFFS INTO LUNGS EVERY 4 HOURS AS NEEDED (Patient taking differently: Inhale 1-2 puffs into the lungs every 4 (four) hours as needed for wheezing or shortness of breath. ) 15 g 2  . levalbuterol (XOPENEX) 0.63 MG/3ML nebulizer solution Take 0.63 mg by nebulization every 4 (four) hours as needed for wheezing or shortness of breath.    Marland Kitchen LORazepam (ATIVAN) 0.5 MG tablet Take 1 tablet (0.5 mg total) by mouth 2 (two) times daily. (Patient taking differently: Take 0.5 mg by mouth daily as needed for anxiety. ) 60 tablet 0  . montelukast (SINGULAIR) 10 MG tablet TAKE ONE TABLET BY MOUTH AT BEDTIME 90 tablet 1  . Multiple Vitamin (MULTIVITAMIN WITH MINERALS) TABS tablet Take 1 tablet by mouth daily.    . nebivolol (BYSTOLIC) 5 MG tablet Take 1 tablet (5 mg total) by mouth daily. 30 tablet 5  . olopatadine (PATANOL) 0.1 % ophthalmic solution Place 1 drop into both eyes 2 (two) times daily. (Patient taking differently: Place 1 drop into both eyes 2 (two) times daily as needed for allergies. ) 5 mL 5  . pantoprazole (PROTONIX) 40 MG tablet Take 1 tablet (40 mg total) by mouth daily. 30 tablet 3  . propranolol  (INDERAL) 10 MG tablet Take 1 tablet (10 mg total) by mouth 3 (three) times daily as needed. (Patient taking differently: Take 10 mg by mouth 3 (three) times daily as needed (palpitations). ) 90 tablet 6  . ranitidine (ZANTAC) 150 MG tablet Take 150 mg by mouth at bedtime.     . SUMAtriptan (IMITREX) 25 MG tablet Take 2 tablets (50 mg total) by mouth every 2 (two) hours as needed for migraine. 10 tablet 2   No current facility-administered medications on file prior to visit.     Allergies  Allergen Reactions  . Penicillins Hives    Has patient had a PCN reaction causing immediate rash, facial/tongue/throat swelling, SOB or lightheadedness with hypotension: No Has patient had a PCN reaction causing severe rash involving mucus membranes or skin necrosis: No Has patient had a PCN reaction that required hospitalization: No Has patient had a PCN reaction occurring within the last 10 years: No If all of the above answers are "NO", then may proceed with Cephalosporin use.   . Pseudoephedrine Hcl Palpitations    Past Medical History:  Diagnosis Date  . Allergic rhinitis, cause unspecified   . Allergy   . Anemia    history of  . Anxiety   . Elevated blood pressure reading without diagnosis of hypertension   . Esophageal reflux   . Extrinsic asthma, unspecified   . Leukocytosis, unspecified   . Pain  in joint, shoulder region   . Personal history of traumatic fracture   . Tachycardia    with anxiety  . Unspecified vitamin D deficiency     No past surgical history on file.  Family History  Problem Relation Age of Onset  . Heart disease Father     Social History   Social History  . Marital status: Married    Spouse name: N/A  . Number of children: N/A  . Years of education: N/A   Occupational History  . Not on file.   Social History Main Topics  . Smoking status: Never Smoker  . Smokeless tobacco: Never Used  . Alcohol use No  . Drug use: No  . Sexual activity: Not on  file   Other Topics Concern  . Not on file   Social History Narrative  . No narrative on file   The PMH, PSH, Social History, Family History, Medications, and allergies have been reviewed in [facility], and have been updated if relevant.  OBJECTIVE: BP 108/72 (BP Location: Left Arm, Patient Position: Sitting, Cuff Size: Normal)   Pulse 80   Temp (!) 97.4 F (36.3 C) (Oral)   Wt 166 lb (75.3 kg)   LMP 03/30/2017 (Exact Date)   SpO2 99%   BMI 26.79 kg/m    Appears well, in no apparent distress.  Vital signs are normal. The abdomen is distended, hard- uterus enlarged, without tenderness, guarding, mass, rebound or organomegaly. No CVA tenderness or inguinal adenopathy noted. Urine dipstick shows negative for all components.    ASSESSMENT: urinary frequency  PLAN: UA neg and reassuring.  I suspect this is due to the pressure her enlarging uterus is placing on her bladder.  Advised to follow up after her hysterectomy if her symptoms persist. The patient indicates understanding of these issues and agrees with the plan.

## 2017-04-28 ENCOUNTER — Encounter: Payer: Self-pay | Admitting: Cardiovascular Disease

## 2017-04-29 NOTE — Telephone Encounter (Signed)
Medication Samples have been provided to the patient.  Drug name: Bystolic       Strength: 10 mg        Qty: 4 bottles  LOT: [id number]  Exp.Date: 12/18

## 2017-04-30 ENCOUNTER — Inpatient Hospital Stay (HOSPITAL_COMMUNITY)
Admission: RE | Admit: 2017-04-30 | Discharge: 2017-05-01 | DRG: 743 | Disposition: A | Discharge status: Home / Self Care | Payer: 59 | Source: Ambulatory Visit | Attending: Obstetrics and Gynecology | Admitting: Obstetrics and Gynecology

## 2017-04-30 ENCOUNTER — Encounter (HOSPITAL_COMMUNITY): Payer: Self-pay

## 2017-04-30 ENCOUNTER — Encounter (HOSPITAL_COMMUNITY)
Admission: RE | Disposition: A | Discharge status: Home / Self Care | Payer: Self-pay | Source: Ambulatory Visit | Attending: Obstetrics and Gynecology

## 2017-04-30 ENCOUNTER — Inpatient Hospital Stay (HOSPITAL_COMMUNITY): Payer: 59 | Admitting: Anesthesiology

## 2017-04-30 DIAGNOSIS — D259 Leiomyoma of uterus, unspecified: Principal | ICD-10-CM | POA: Diagnosis present

## 2017-04-30 DIAGNOSIS — Z880 Allergy status to penicillin: Secondary | ICD-10-CM

## 2017-04-30 DIAGNOSIS — J45909 Unspecified asthma, uncomplicated: Secondary | ICD-10-CM | POA: Diagnosis present

## 2017-04-30 DIAGNOSIS — N920 Excessive and frequent menstruation with regular cycle: Secondary | ICD-10-CM | POA: Diagnosis present

## 2017-04-30 DIAGNOSIS — K219 Gastro-esophageal reflux disease without esophagitis: Secondary | ICD-10-CM | POA: Diagnosis present

## 2017-04-30 DIAGNOSIS — Z90710 Acquired absence of both cervix and uterus: Secondary | ICD-10-CM | POA: Diagnosis present

## 2017-04-30 HISTORY — PX: HYSTERECTOMY ABDOMINAL WITH SALPINGECTOMY: SHX6725

## 2017-04-30 LAB — PREGNANCY, URINE: Preg Test, Ur: NEGATIVE

## 2017-04-30 SURGERY — HYSTERECTOMY, TOTAL, ABDOMINAL, WITH SALPINGECTOMY
Anesthesia: General | Site: Abdomen | Laterality: Bilateral

## 2017-04-30 MED ORDER — PROPOFOL 10 MG/ML IV BOLUS
INTRAVENOUS | Status: DC | PRN
  Administered 2017-04-30: 200 mg via INTRAVENOUS

## 2017-04-30 MED ORDER — LIDOCAINE HCL (CARDIAC) 20 MG/ML IV SOLN
INTRAVENOUS | Status: DC | PRN
  Administered 2017-04-30: 80 mg via INTRAVENOUS

## 2017-04-30 MED ORDER — MIDAZOLAM HCL 2 MG/2ML IJ SOLN
INTRAMUSCULAR | Status: DC | PRN
  Administered 2017-04-30: 2 mg via INTRAVENOUS

## 2017-04-30 MED ORDER — SCOPOLAMINE 1 MG/3DAYS TD PT72
1.0000 | MEDICATED_PATCH | Freq: Once | TRANSDERMAL | Status: DC
  Administered 2017-04-30: 1.5 mg via TRANSDERMAL

## 2017-04-30 MED ORDER — ONDANSETRON HCL 4 MG/2ML IJ SOLN
INTRAMUSCULAR | Status: DC | PRN
  Administered 2017-04-30: 4 mg via INTRAVENOUS

## 2017-04-30 MED ORDER — LACTATED RINGERS IV SOLN
INTRAVENOUS | Status: DC
  Administered 2017-04-30 (×3): via INTRAVENOUS

## 2017-04-30 MED ORDER — ALBUTEROL SULFATE HFA 108 (90 BASE) MCG/ACT IN AERS
INHALATION_SPRAY | RESPIRATORY_TRACT | Status: AC
  Filled 2017-04-30: qty 6.7

## 2017-04-30 MED ORDER — LIDOCAINE HCL 1 % IJ SOLN
INTRAMUSCULAR | Status: AC
  Filled 2017-04-30: qty 20

## 2017-04-30 MED ORDER — METOCLOPRAMIDE HCL 5 MG/ML IJ SOLN: 10.0000 mg | Freq: Once | INTRAMUSCULAR | Status: DC | PRN

## 2017-04-30 MED ORDER — LACTATED RINGERS IV SOLN: INTRAVENOUS | Status: DC

## 2017-04-30 MED ORDER — ALBUTEROL SULFATE (2.5 MG/3ML) 0.083% IN NEBU: 2.5000 mg | INHALATION_SOLUTION | RESPIRATORY_TRACT | Status: DC | PRN

## 2017-04-30 MED ORDER — LACTATED RINGERS IV SOLN
INTRAVENOUS | Status: DC
  Administered 2017-04-30 – 2017-05-01 (×3): via INTRAVENOUS

## 2017-04-30 MED ORDER — PROPOFOL 10 MG/ML IV BOLUS
INTRAVENOUS | Status: AC
  Filled 2017-04-30: qty 20

## 2017-04-30 MED ORDER — ALBUTEROL SULFATE HFA 108 (90 BASE) MCG/ACT IN AERS
INHALATION_SPRAY | RESPIRATORY_TRACT | Status: DC | PRN
  Administered 2017-04-30: 2 via RESPIRATORY_TRACT

## 2017-04-30 MED ORDER — KETOROLAC TROMETHAMINE 30 MG/ML IJ SOLN
INTRAMUSCULAR | Status: DC | PRN
  Administered 2017-04-30: 30 mg via INTRAVENOUS

## 2017-04-30 MED ORDER — ONDANSETRON HCL 4 MG/2ML IJ SOLN
INTRAMUSCULAR | Status: AC
  Filled 2017-04-30: qty 2

## 2017-04-30 MED ORDER — FENTANYL CITRATE (PF) 100 MCG/2ML IJ SOLN
INTRAMUSCULAR | Status: DC | PRN
  Administered 2017-04-30: 100 ug via INTRAVENOUS
  Administered 2017-04-30 (×3): 50 ug via INTRAVENOUS

## 2017-04-30 MED ORDER — DIPHENHYDRAMINE HCL 50 MG/ML IJ SOLN: 12.5000 mg | Freq: Four times a day (QID) | INTRAMUSCULAR | Status: DC | PRN

## 2017-04-30 MED ORDER — DEXAMETHASONE SODIUM PHOSPHATE 4 MG/ML IJ SOLN
INTRAMUSCULAR | Status: DC | PRN
  Administered 2017-04-30: 4 mg via INTRAVENOUS

## 2017-04-30 MED ORDER — SODIUM CHLORIDE 0.9% FLUSH: 9.0000 mL | INTRAVENOUS | Status: DC | PRN

## 2017-04-30 MED ORDER — SCOPOLAMINE 1 MG/3DAYS TD PT72
MEDICATED_PATCH | TRANSDERMAL | Status: AC
  Administered 2017-04-30: 1.5 mg via TRANSDERMAL
  Filled 2017-04-30: qty 1

## 2017-04-30 MED ORDER — NEBIVOLOL HCL 5 MG PO TABS
5.0000 mg | ORAL_TABLET | Freq: Every day | ORAL | Status: DC
  Administered 2017-05-01: 5 mg via ORAL
  Filled 2017-04-30 (×2): qty 1

## 2017-04-30 MED ORDER — MONTELUKAST SODIUM 10 MG PO TABS
10.0000 mg | ORAL_TABLET | Freq: Every day | ORAL | Status: DC
  Filled 2017-04-30 (×2): qty 1

## 2017-04-30 MED ORDER — KETOROLAC TROMETHAMINE 30 MG/ML IJ SOLN: 30.0000 mg | Freq: Once | INTRAMUSCULAR | Status: DC

## 2017-04-30 MED ORDER — DEXAMETHASONE SODIUM PHOSPHATE 4 MG/ML IJ SOLN
INTRAMUSCULAR | Status: AC
  Filled 2017-04-30: qty 1

## 2017-04-30 MED ORDER — TRAMADOL HCL 50 MG PO TABS: 50.0000 mg | ORAL_TABLET | Freq: Four times a day (QID) | ORAL | Status: DC | PRN

## 2017-04-30 MED ORDER — MEPERIDINE HCL 25 MG/ML IJ SOLN: 6.2500 mg | INTRAMUSCULAR | Status: DC | PRN

## 2017-04-30 MED ORDER — FENTANYL CITRATE (PF) 100 MCG/2ML IJ SOLN
25.0000 ug | INTRAMUSCULAR | Status: DC | PRN
  Administered 2017-04-30: 25 ug via INTRAVENOUS
  Administered 2017-04-30: 50 ug via INTRAVENOUS

## 2017-04-30 MED ORDER — NALOXONE HCL 0.4 MG/ML IJ SOLN: 0.4000 mg | INTRAMUSCULAR | Status: DC | PRN

## 2017-04-30 MED ORDER — CITALOPRAM HYDROBROMIDE 20 MG PO TABS
20.0000 mg | ORAL_TABLET | Freq: Every day | ORAL | Status: DC
  Administered 2017-05-01: 20 mg via ORAL
  Filled 2017-04-30 (×2): qty 1

## 2017-04-30 MED ORDER — ROCURONIUM BROMIDE 100 MG/10ML IV SOLN
INTRAVENOUS | Status: DC | PRN
  Administered 2017-04-30: 40 mg via INTRAVENOUS

## 2017-04-30 MED ORDER — MENTHOL 3 MG MT LOZG: 1.0000 | LOZENGE | OROMUCOSAL | Status: DC | PRN

## 2017-04-30 MED ORDER — MIDAZOLAM HCL 2 MG/2ML IJ SOLN
INTRAMUSCULAR | Status: AC
  Filled 2017-04-30: qty 2

## 2017-04-30 MED ORDER — LEVALBUTEROL TARTRATE 45 MCG/ACT IN AERO: 1.0000 | INHALATION_SPRAY | RESPIRATORY_TRACT | Status: DC | PRN

## 2017-04-30 MED ORDER — FENTANYL CITRATE (PF) 250 MCG/5ML IJ SOLN
INTRAMUSCULAR | Status: AC
  Filled 2017-04-30: qty 5

## 2017-04-30 MED ORDER — HYDROMORPHONE HCL 1 MG/ML IJ SOLN
INTRAMUSCULAR | Status: DC | PRN
  Administered 2017-04-30: 1 mg via INTRAVENOUS

## 2017-04-30 MED ORDER — LIDOCAINE HCL (CARDIAC) 20 MG/ML IV SOLN
INTRAVENOUS | Status: AC
  Filled 2017-04-30: qty 5

## 2017-04-30 MED ORDER — BUPIVACAINE HCL (PF) 0.25 % IJ SOLN
INTRAMUSCULAR | Status: AC
  Filled 2017-04-30: qty 30

## 2017-04-30 MED ORDER — LORAZEPAM 1 MG PO TABS
0.5000 mg | ORAL_TABLET | Freq: Two times a day (BID) | ORAL | Status: DC
  Administered 2017-05-01: 0.5 mg via ORAL
  Filled 2017-04-30: qty 1

## 2017-04-30 MED ORDER — IBUPROFEN 600 MG PO TABS
600.0000 mg | ORAL_TABLET | Freq: Four times a day (QID) | ORAL | Status: DC | PRN
  Administered 2017-05-01: 600 mg via ORAL
  Filled 2017-04-30: qty 1

## 2017-04-30 MED ORDER — DIPHENHYDRAMINE HCL 12.5 MG/5ML PO ELIX
12.5000 mg | ORAL_SOLUTION | Freq: Four times a day (QID) | ORAL | Status: DC | PRN
  Administered 2017-04-30: 12.5 mg via ORAL
  Filled 2017-04-30: qty 5

## 2017-04-30 MED ORDER — HYDROMORPHONE 1 MG/ML IV SOLN
INTRAVENOUS | Status: DC
  Administered 2017-04-30: 11:00:00 via INTRAVENOUS
  Administered 2017-04-30: 2.6 mg via INTRAVENOUS
  Administered 2017-04-30: 1.4 mg via INTRAVENOUS
  Administered 2017-04-30: 5.2 mg via INTRAVENOUS
  Administered 2017-05-01: 0.6 mg via INTRAVENOUS
  Administered 2017-05-01: 0.8 mg via INTRAVENOUS
  Filled 2017-04-30: qty 25

## 2017-04-30 MED ORDER — ROCURONIUM BROMIDE 100 MG/10ML IV SOLN
INTRAVENOUS | Status: AC
  Filled 2017-04-30: qty 1

## 2017-04-30 MED ORDER — SUGAMMADEX SODIUM 200 MG/2ML IV SOLN
INTRAVENOUS | Status: DC | PRN
  Administered 2017-04-30: 200 mg via INTRAVENOUS

## 2017-04-30 MED ORDER — ONDANSETRON HCL 4 MG/2ML IJ SOLN: 4.0000 mg | Freq: Four times a day (QID) | INTRAMUSCULAR | Status: DC | PRN

## 2017-04-30 MED ORDER — HYDROMORPHONE HCL 1 MG/ML IJ SOLN
INTRAMUSCULAR | Status: AC
  Filled 2017-04-30: qty 1

## 2017-04-30 MED ORDER — FENTANYL CITRATE (PF) 100 MCG/2ML IJ SOLN
INTRAMUSCULAR | Status: AC
  Filled 2017-04-30: qty 2

## 2017-04-30 MED ORDER — SUGAMMADEX SODIUM 200 MG/2ML IV SOLN
INTRAVENOUS | Status: AC
  Filled 2017-04-30: qty 2

## 2017-04-30 MED ORDER — DEXTROSE 5 % IV SOLN
INTRAVENOUS | Status: AC
  Administered 2017-04-30: 115.5 mL via INTRAVENOUS
  Filled 2017-04-30: qty 9.5

## 2017-04-30 SURGICAL SUPPLY — 37 items
CANISTER SUCT 3000ML PPV (MISCELLANEOUS) ×3 IMPLANT
CLOTH BEACON ORANGE TIMEOUT ST (SAFETY) ×3 IMPLANT
DERMABOND ADVANCED (GAUZE/BANDAGES/DRESSINGS) ×2
DERMABOND ADVANCED .7 DNX12 (GAUZE/BANDAGES/DRESSINGS) ×1 IMPLANT
DRAPE CESAREAN BIRTH W POUCH (DRAPES) ×3 IMPLANT
DRAPE WARM FLUID 44X44 (DRAPE) IMPLANT
DRSG OPSITE POSTOP 4X10 (GAUZE/BANDAGES/DRESSINGS) ×3 IMPLANT
DURAPREP 26ML APPLICATOR (WOUND CARE) ×3 IMPLANT
GAUZE SPONGE 4X4 16PLY XRAY LF (GAUZE/BANDAGES/DRESSINGS) IMPLANT
GLOVE BIO SURGEON STRL SZ 6.5 (GLOVE) ×2 IMPLANT
GLOVE BIO SURGEONS STRL SZ 6.5 (GLOVE) ×1
GLOVE BIOGEL PI IND STRL 7.0 (GLOVE) ×2 IMPLANT
GLOVE BIOGEL PI INDICATOR 7.0 (GLOVE) ×4
GOWN STRL REUS W/TWL LRG LVL3 (GOWN DISPOSABLE) ×9 IMPLANT
NEEDLE HYPO 22GX1.5 SAFETY (NEEDLE) ×3 IMPLANT
NS IRRIG 1000ML POUR BTL (IV SOLUTION) ×3 IMPLANT
PACK ABDOMINAL GYN (CUSTOM PROCEDURE TRAY) ×3 IMPLANT
PAD OB MATERNITY 4.3X12.25 (PERSONAL CARE ITEMS) ×3 IMPLANT
PENCIL SMOKE EVAC W/HOLSTER (ELECTROSURGICAL) ×3 IMPLANT
PROTECTOR NERVE ULNAR (MISCELLANEOUS) ×6 IMPLANT
SEPRAFILM MEMBRANE 5X6 (MISCELLANEOUS) IMPLANT
SPONGE LAP 18X18 X RAY DECT (DISPOSABLE) IMPLANT
STAPLER VISISTAT 35W (STAPLE) IMPLANT
SUT PDS AB 0 CT 36 (SUTURE) IMPLANT
SUT PDS AB 0 CTX 60 (SUTURE) IMPLANT
SUT PLAIN 2 0 XLH (SUTURE) IMPLANT
SUT VIC AB 0 CT1 18XCR BRD8 (SUTURE) ×2 IMPLANT
SUT VIC AB 0 CT1 27 (SUTURE) ×8
SUT VIC AB 0 CT1 27XBRD ANBCTR (SUTURE) ×4 IMPLANT
SUT VIC AB 0 CT1 8-18 (SUTURE) ×4
SUT VIC AB 3-0 PS1 18 (SUTURE)
SUT VIC AB 3-0 PS1 18X BRD (SUTURE) IMPLANT
SUT VIC AB 4-0 KS 27 (SUTURE) ×3 IMPLANT
SUT VICRYL 0 TIES 12 18 (SUTURE) ×3 IMPLANT
SYR CONTROL 10ML LL (SYRINGE) ×3 IMPLANT
TOWEL OR 17X24 6PK STRL BLUE (TOWEL DISPOSABLE) ×6 IMPLANT
TRAY FOLEY CATH SILVER 14FR (SET/KITS/TRAYS/PACK) ×3 IMPLANT

## 2017-04-30 NOTE — Op Note (Signed)
Mackenzie Shea, Mackenzie Shea               ACCOUNT NO.:  [id number]  MEDICAL RECORD NO.:  [id number]  LOCATION:                                 FACILITY:  PHYSICIAN:  Arnola Crittendon L. Helane Rima, M.D.    DATE OF BIRTH:  DATE OF PROCEDURE:  04/30/2017 DATE OF DISCHARGE:                              OPERATIVE REPORT   PREOPERATIVE DIAGNOSIS:  Symptomatic fibroids.  POSTOPERATIVE DIAGNOSIS:  Symptomatic fibroids.  PROCEDURES: 1. Total abdominal hysterectomy. 2. Bilateral salpingectomy.  SURGEON:  Jb Dulworth L. Helane Rima, M.D.  ASSISTANT:  Ralene Bathe. Matthew Saras, M.D.  ANESTHESIA:  General.  ESTIMATED BLOOD LOSS:  250 mL.  COMPLICATIONS:  None.  DESCRIPTION OF PROCEDURE:  The patient was taken to the operating room. She was intubated.  She was prepped and draped.  A Foley catheter was inserted.  A time-out was performed.  Exam under anesthesia revealed a myomatous uterus with a fibroid in the right upper quadrant all the way up to the costal margin.  A Pfannenstiel incision was made and carried down to the fascia.  The fascia was scored in the midline, extended laterally.  The peritoneum was entered bluntly and the incision was extended.  Upon entering into the abdominal cavity, we saw a myomatous uterus, adnexa were normal.  We were able to rotate the uterus slightly and exteriorize it.  She had multiple fibroids and some ranged in size from 3 cm to 15 cm.  We then identified the round ligament on the right side, suture ligated that with 0 Vicryl suture.  I then placed a curved Haney clamp beneath the mesosalpinx and the pedicle was cut after clamping and suture ligated using 0 Vicryl suture.  I then placed a clamp across the triple pedicle to preserve the ovary.  The pedicle was clamped, cut, and suture ligated using 0 Vicryl suture and further secured with a second tie of 0 Vicryl stitch.  I then developed the bladder flap.  The bladder was pulled away in, so we had to use Metzenbaum scissors  and sharp and blunt dissection in order to get the bladder down out of the way.  I then placed a curved Heaney clamp across the uterine artery.  At the level of the internal os, each pedicle was clamped, cut, and suture ligated using 0 Vicryl suture.  We then went over to the left side of the pelvis and did the identical thing by preserving the ovary.  Once we had the uterine artery clamped over there on the left side, we then amputated the top of the uterus that was set aside.  We then developed the bladder flap easily and further placed straight Haney clamps were just snugged beside the cervix.  Each pedicle was clamped, cut, and suture ligated using 0 Vicryl suture.  We then placed curved Heaney clamps just beneath the external os, entered the vagina in standard fashion, removed the specimen, identified the cervix, and closed the vaginal cuff using 0 Vicryl suture.  Irrigation was performed.  All pedicles were inspected and noted to be hemostatic.  No excessive bleeding was noted.  No retractor was used during this procedure.  All instruments were removed from the abdominal  cavity.  The peritoneum was closed using 0 Vicryl.  The rectus muscles were reapproximated using 0 Vicryl.  The fascia was closed using 0 Vicryl running stitch and moving to the midline, starting each corner, and the subcu was closed with 4-0 Vicryl.  Dermabond was applied.  All sponge, lap, and instrument counts were correct x2.  The patient went to recovery room in stable condition.     See Beharry L. Helane Rima, M.D.     Nevin Bloodgood  D:  04/30/2017  T:  04/30/2017  Job:  [id number]

## 2017-04-30 NOTE — H&P (Signed)
36 year old G 0 with known symptomatic fibroids. In 2014 her fibroids were 14 week size. Now they are 18 week size. She is reporting heavy periods, bloating and discomfort.  Ultrasound - largest fibroid in right upper quadrant measuring 14 x 11 cm.  Past Medical History:  Diagnosis Date  . Allergic rhinitis, cause unspecified   . Allergy   . Anemia    history of  . Anxiety   . Elevated blood pressure reading without diagnosis of hypertension   . Esophageal reflux   . Extrinsic asthma, unspecified   . Leukocytosis, unspecified   . Pain in joint, shoulder region   . Personal history of traumatic fracture   . Tachycardia    with anxiety  . Unspecified vitamin D deficiency    No past surgical history on file.  Prior to Admission medications   Medication Sig Start Date End Date Taking? Authorizing Provider  cetirizine (ZYRTEC) 10 MG tablet Take 10 mg by mouth at bedtime as needed for allergies.    Yes [provider]  citalopram (CELEXA) 20 MG tablet 1/2 tab by mouth daily x 1 week and then proceed to 1 tablet daily if well tolerated. Patient taking differently: Take 20 mg by mouth daily.  12/15/16  Yes Lucille Passy, MD  fluticasone (VERAMYST) 27.5 MCG/SPRAY nasal spray Place 2 sprays into the nose daily as needed. Reported on 12/22/2015   Yes [provider]  levalbuterol (XOPENEX HFA) 45 MCG/ACT inhaler INHALE ONE TO TWO PUFFS INTO LUNGS EVERY 4 HOURS AS NEEDED Patient taking differently: Inhale 1-2 puffs into the lungs every 4 (four) hours as needed for wheezing or shortness of breath.  01/12/17  Yes Lucille Passy, MD  LORazepam (ATIVAN) 0.5 MG tablet Take 1 tablet (0.5 mg total) by mouth 2 (two) times daily. Patient taking differently: Take 0.5 mg by mouth daily as needed for anxiety.  02/04/17  Yes Lucille Passy, MD  montelukast (SINGULAIR) 10 MG tablet TAKE ONE TABLET BY MOUTH AT BEDTIME 04/16/17  Yes Lucille Passy, MD  Multiple Vitamin (MULTIVITAMIN WITH MINERALS)  TABS tablet Take 1 tablet by mouth daily.   Yes [provider]  nebivolol (BYSTOLIC) 5 MG tablet Take 1 tablet (5 mg total) by mouth daily. 03/06/17  Yes Lucille Passy, MD  olopatadine (PATANOL) 0.1 % ophthalmic solution Place 1 drop into both eyes 2 (two) times daily. Patient taking differently: Place 1 drop into both eyes 2 (two) times daily as needed for allergies.  10/22/16  Yes Burchette, Alinda Sierras, MD  levalbuterol Penne Lash) 0.63 MG/3ML nebulizer solution Take 0.63 mg by nebulization every 4 (four) hours as needed for wheezing or shortness of breath.    [provider]  pantoprazole (PROTONIX) 40 MG tablet Take 1 tablet (40 mg total) by mouth daily. 12/01/16   Lucille Passy, MD  propranolol (INDERAL) 10 MG tablet Take 1 tablet (10 mg total) by mouth 3 (three) times daily as needed. Patient taking differently: Take 10 mg by mouth 3 (three) times daily as needed (palpitations).  12/10/16   Minna Merritts, MD  ranitidine (ZANTAC) 150 MG tablet Take 150 mg by mouth at bedtime.     [provider]  SUMAtriptan (IMITREX) 25 MG tablet Take 2 tablets (50 mg total) by mouth every 2 (two) hours as needed for migraine. 11/28/16   Lucille Passy, MD   Allergies - Penicillin  Family History  Problem Relation Age of Onset  . Heart  disease Father    Social History   Social History  . Marital status: Married    Spouse name: N/A  . Number of children: N/A  . Years of education: N/A   Social History Main Topics  . Smoking status: Never Smoker  . Smokeless tobacco: Never Used  . Alcohol use No  . Drug use: No  . Sexual activity: Yes    Birth control/ protection: None   Other Topics Concern  . Not on file   Social History Narrative  . No narrative on file   BP (!) 128/99   Pulse (!) 110   Temp 97.9 F (36.6 C) (Oral)   Resp 18   LMP 04/30/2017 (Exact Date)  Results for orders placed or performed during the hospital encounter of 04/30/17 (from the past 24 hour(s))   Pregnancy, urine     Status: None   Collection Time: 04/30/17  6:00 AM  Result Value Ref Range   Preg Test, Ur NEGATIVE NEGATIVE   General alert and oriented Lung CTAB Car RRR  Abdomen palpable myomatous uterus  IMPRESSION: Symptomatic fibroids  PLAN: TAH BS Risks reviewed Consent signed

## 2017-04-30 NOTE — Anesthesia Postprocedure Evaluation (Signed)
Anesthesia Post Note  Patient: Mackenzie Shea  Procedure(s) Performed: Procedure(s) (LRB): HYSTERECTOMY ABDOMINAL WITH SALPINGECTOMY (Bilateral)     Patient location during evaluation: [facility] Anesthesia Type: General Level of consciousness: awake Pain management: pain level controlled Vital Signs Assessment: post-procedure vital signs reviewed and stable Respiratory status: spontaneous breathing Cardiovascular status: stable Postop Assessment: no signs of nausea or vomiting and adequate PO intake Anesthetic complications: no    Last Vitals:  Vitals:   04/30/17 1449 04/30/17 1520  BP:    Pulse: (!) 54 68  Resp: 12 15  Temp:    SpO2: 96% 98%    Last Pain:  Vitals:   04/30/17 1520  TempSrc:   PainSc: Asleep   Pain Goal: Patients Stated Pain Goal: 3 (04/30/17 1345)               Emiliya Chretien

## 2017-04-30 NOTE — Anesthesia Procedure Notes (Signed)
Procedure Name: Intubation Date/Time: 04/30/2017 7:32 AM Performed by: Elenore Paddy Pre-anesthesia Checklist: Patient identified, Emergency Drugs available, Suction available, Patient being monitored and Timeout performed Patient Re-evaluated:Patient Re-evaluated prior to induction Oxygen Delivery Method: Circle system utilized Preoxygenation: Pre-oxygenation with 100% oxygen Induction Type: IV induction Ventilation: Mask ventilation without difficulty Laryngoscope Size: Mac and 3 Grade View: Grade I Tube type: Oral Tube size: 7.0 mm Number of attempts: 1 Airway Equipment and Method: Stylet Secured at: 21 cm Tube secured with: Tape Dental Injury: Teeth and Oropharynx as per pre-operative assessment

## 2017-04-30 NOTE — Brief Op Note (Signed)
04/30/2017  8:33 AM  PATIENT:  Mackenzie Shea  36 y.o. female  PRE-OPERATIVE DIAGNOSIS:  fibroids  POST-OPERATIVE DIAGNOSIS:  fibroids  PROCEDURE:  Procedure(s): HYSTERECTOMY ABDOMINAL WITH SALPINGECTOMY (Bilateral)  SURGEON:  Surgeon(s) and Role:    * Dian Queen, MD - Primary    * Molli Posey, MD - Assisting  PHYSICIAN ASSISTANT:   ASSISTANTS: none   ANESTHESIA:   general  EBL:  Total I/O In: 2100 [I.V.:2100] Out: 350 [Urine:100; Blood:250]  BLOOD ADMINISTERED:none  DRAINS: Urinary Catheter (Foley)   LOCAL MEDICATIONS USED:  NONE  SPECIMEN:  Source of Specimen:  uterus, cervix and tubes  DISPOSITION OF SPECIMEN:  PATHOLOGY  COUNTS:  YES  TOURNIQUET:  * No tourniquets in log *  DICTATION: .Other Dictation: Dictation Number [id number]  PLAN OF CARE: Admit to inpatient   PATIENT DISPOSITION:  PACU - hemodynamically stable.   Delay start of Pharmacological VTE agent (>24hrs) due to surgical blood loss or risk of bleeding: not applicable

## 2017-04-30 NOTE — Addendum Note (Signed)
Addendum  created 04/30/17 1602 by Ignacia Bayley, CRNA   Sign clinical note

## 2017-04-30 NOTE — Anesthesia Postprocedure Evaluation (Signed)
Anesthesia Post Note  Patient: Mackenzie Shea  Procedure(s) Performed: Procedure(s) (LRB): HYSTERECTOMY ABDOMINAL WITH SALPINGECTOMY (Bilateral)     Patient location during evaluation: PACU Anesthesia Type: General Level of consciousness: awake and alert Pain management: pain level controlled Vital Signs Assessment: post-procedure vital signs reviewed and stable Respiratory status: spontaneous breathing, nonlabored ventilation, respiratory function stable and patient connected to nasal cannula oxygen Cardiovascular status: blood pressure returned to baseline and stable Postop Assessment: no signs of nausea or vomiting Anesthetic complications: no    Last Vitals:  Vitals:   04/30/17 1130 04/30/17 1230  BP: 112/62 (!) 107/57  Pulse: 63 72  Resp: 18 16  Temp: 36.6 C 36.7 C  SpO2: 100% 100%    Last Pain:  Vitals:   04/30/17 1230  TempSrc: Axillary  PainSc:    Pain Goal: Patients Stated Pain Goal: 3 (04/30/17 1157)               Montez Hageman

## 2017-04-30 NOTE — Transfer of Care (Signed)
Immediate Anesthesia Transfer of Care Note  Patient: Mackenzie Shea  Procedure(s) Performed: Procedure(s): HYSTERECTOMY ABDOMINAL WITH SALPINGECTOMY (Bilateral)  Patient Location: PACU  Anesthesia Type:General  Level of Consciousness: awake, alert  and oriented  Airway & Oxygen Therapy: Patient Spontanous Breathing and Patient connected to nasal cannula oxygen  Post-op Assessment: Report given to RN and Post -op Vital signs reviewed and stable  Post vital signs: Reviewed and stable BP 109/56, HR 76, RR 10, SaO2 99%  Last Vitals:  Vitals:   04/30/17 0615  BP: (!) 128/99  Pulse: (!) 110  Resp: 18  Temp: 36.6 C    Last Pain:  Vitals:   04/30/17 0615  TempSrc: Oral      Patients Stated Pain Goal: 3 (32/76/14 7092)  Complications: No apparent anesthesia complications

## 2017-04-30 NOTE — Anesthesia Preprocedure Evaluation (Signed)
Anesthesia Evaluation  Patient identified by MRN, date of birth, ID band Patient awake    Reviewed: Allergy & Precautions, NPO status , Patient's Chart, lab work & pertinent test results  Airway Mallampati: II  TM Distance: >3 FB Neck ROM: Full    Dental no notable dental hx.    Pulmonary asthma ,    Pulmonary exam normal breath sounds clear to auscultation       Cardiovascular Normal cardiovascular exam Rhythm:Regular Rate:Normal  tachycardia   Neuro/Psych negative neurological ROS  negative psych ROS   GI/Hepatic negative GI ROS, Neg liver ROS,   Endo/Other  negative endocrine ROS  Renal/GU negative Renal ROS  negative genitourinary   Musculoskeletal negative musculoskeletal ROS (+)   Abdominal   Peds negative pediatric ROS (+)  Hematology negative hematology ROS (+)   Anesthesia Other Findings   Reproductive/Obstetrics negative OB ROS                             Anesthesia Physical Anesthesia Plan  ASA: II  Anesthesia Plan: General   Post-op Pain Management:    Induction: Intravenous  PONV Risk Score and Plan: 4 or greater and Ondansetron, Dexamethasone, Midazolam, Scopolamine patch - Pre-op and Treatment may vary due to age or medical condition  Airway Management Planned: Oral ETT  Additional Equipment:   Intra-op Plan:   Post-operative Plan: Extubation in OR  Informed Consent: I have reviewed the patients History and Physical, chart, labs and discussed the procedure including the risks, benefits and alternatives for the proposed anesthesia with the patient or authorized representative who has indicated his/her understanding and acceptance.   Dental advisory given  Plan Discussed with: CRNA  Anesthesia Plan Comments:         Anesthesia Quick Evaluation

## 2017-05-01 ENCOUNTER — Encounter (HOSPITAL_COMMUNITY): Payer: Self-pay | Admitting: Obstetrics and Gynecology

## 2017-05-01 HISTORY — PX: ABDOMINAL HYSTERECTOMY: SHX81

## 2017-05-01 LAB — BASIC METABOLIC PANEL WITH GFR
Anion gap: 4 — ABNORMAL LOW (ref 5–15)
BUN: 9 mg/dL (ref 6–20)
CO2: 27 mmol/L (ref 22–32)
Calcium: 8.3 mg/dL — ABNORMAL LOW (ref 8.9–10.3)
Chloride: 105 mmol/L (ref 101–111)
Creatinine, Ser: 0.85 mg/dL (ref 0.44–1.00)
GFR calc Af Amer: >60 mL/min
GFR calc non Af Amer: >60 mL/min
Glucose, Bld: 86 mg/dL (ref 65–99)
Potassium: 4.1 mmol/L (ref 3.5–5.1)
Sodium: 136 mmol/L (ref 135–145)

## 2017-05-01 LAB — CBC
HCT: 32.2 % — ABNORMAL LOW (ref 36.0–46.0)
Hemoglobin: 10.9 g/dL — ABNORMAL LOW (ref 12.0–15.0)
MCH: 26.7 pg (ref 26.0–34.0)
MCHC: 33.9 g/dL (ref 30.0–36.0)
MCV: 78.9 fL (ref 78.0–100.0)
Platelets: 234 10*3/uL (ref 150–400)
RBC: 4.08 MIL/uL (ref 3.87–5.11)
RDW: 13.9 % (ref 11.5–15.5)
WBC: 12.1 10*3/uL — ABNORMAL HIGH (ref 4.0–10.5)

## 2017-05-01 MED ORDER — OXYCODONE-ACETAMINOPHEN 5-325 MG PO TABS: 1.0000 | ORAL_TABLET | ORAL | Status: DC | PRN

## 2017-05-01 MED ORDER — IBUPROFEN 600 MG PO TABS: 600.0000 mg | ORAL_TABLET | Freq: Four times a day (QID) | ORAL | 0 refills | Status: DC | PRN

## 2017-05-01 MED ORDER — OXYCODONE-ACETAMINOPHEN 5-325 MG PO TABS: 1.0000 | ORAL_TABLET | ORAL | 0 refills | Status: DC | PRN

## 2017-05-01 NOTE — Discharge Summary (Signed)
Admission Diagnosis: Symptomatic Fibroids  Discharge Diagnosis: Same  Hospital Course: 36 year old female admitted for TAH and BS. She did very well post op. By the morning of Post op day 1 she had ambulated and was tolerating pos. Her pain was very well controlled. I switched her to po Ibuprofen and po percocet and she was voiding after Foley removed.  BP (!) 98/58 (BP Location: Right Arm)   Pulse (!) 56   Temp 98.2 F (36.8 C) (Oral)   Resp 13   Ht 5\' 6"  (1.676 m)   LMP 04/30/2017 (Exact Date)   SpO2 100%  Abdomen is soft and non tender Bandage clean and dry Results for orders placed or performed during the hospital encounter of 04/30/17 (from the past 24 hour(s))  Basic metabolic panel     Status: Abnormal   Collection Time: 05/01/17  5:48 AM  Result Value Ref Range   Sodium 136 135 - 145 mmol/L   Potassium 4.1 3.5 - 5.1 mmol/L   Chloride 105 101 - 111 mmol/L   CO2 27 22 - 32 mmol/L   Glucose, Bld 86 65 - 99 mg/dL   BUN 9 6 - 20 mg/dL   Creatinine, Ser 0.85 0.44 - 1.00 mg/dL   Calcium 8.3 (L) 8.9 - 10.3 mg/dL   GFR calc non Af Amer >60 >60 mL/min   GFR calc Af Amer >60 >60 mL/min   Anion gap 4 (L) 5 - 15  CBC     Status: Abnormal   Collection Time: 05/01/17  5:48 AM  Result Value Ref Range   WBC 12.1 (H) 4.0 - 10.5 K/uL   RBC 4.08 3.87 - 5.11 MIL/uL   Hemoglobin 10.9 (L) 12.0 - 15.0 g/dL   HCT 32.2 (L) 36.0 - 46.0 %   MCV 78.9 78.0 - 100.0 fL   MCH 26.7 26.0 - 34.0 pg   MCHC 33.9 30.0 - 36.0 g/dL   RDW 13.9 11.5 - 15.5 %   Platelets 234 150 - 400 K/uL   Patient was discharged home in good condition She was given Rx Ibuprofen and Rx Percocet for pain. Follow up in 1 week  Routine discharge instructions given.

## 2017-05-21 ENCOUNTER — Other Ambulatory Visit: Payer: Self-pay | Admitting: Family Medicine

## 2017-05-22 ENCOUNTER — Other Ambulatory Visit: Payer: Self-pay

## 2017-05-22 ENCOUNTER — Encounter: Payer: Self-pay | Admitting: Family Medicine

## 2017-05-22 MED ORDER — PANTOPRAZOLE SODIUM 40 MG PO TBEC: 40.0000 mg | DELAYED_RELEASE_TABLET | Freq: Every day | ORAL | 3 refills | Status: DC

## 2017-07-17 ENCOUNTER — Other Ambulatory Visit: Payer: Self-pay | Admitting: Family Medicine

## 2017-07-17 ENCOUNTER — Encounter: Payer: Self-pay | Admitting: *Deleted

## 2017-07-17 ENCOUNTER — Encounter: Payer: Self-pay | Admitting: Family Medicine

## 2017-07-17 ENCOUNTER — Encounter: Payer: Self-pay | Admitting: Cardiovascular Disease

## 2017-07-17 ENCOUNTER — Other Ambulatory Visit: Payer: Self-pay | Admitting: *Deleted

## 2017-07-17 MED ORDER — NEBIVOLOL HCL 5 MG PO TABS: 5.0000 mg | ORAL_TABLET | Freq: Every day | ORAL | 3 refills | Status: DC

## 2017-07-17 MED ORDER — LORAZEPAM 0.5 MG PO TABS: 0.5000 mg | ORAL_TABLET | Freq: Two times a day (BID) | ORAL | 0 refills | Status: DC

## 2017-07-17 NOTE — Telephone Encounter (Signed)
Called to pharmacy Thanks RM

## 2017-07-17 NOTE — Telephone Encounter (Signed)
Requesting: Lorazepam Contract: None UDS: None Last OV: 8.21.2018 Next OV: Not scheduled Last Refill: 6.06.2018   Please advise

## 2017-07-17 NOTE — Telephone Encounter (Signed)
Sent patient an email.

## 2017-07-20 ENCOUNTER — Telehealth: Payer: Self-pay | Admitting: *Deleted

## 2017-07-20 NOTE — Telephone Encounter (Signed)
Prior authorization for [employer] submitted through [employer]. Representative said should receive a faxed determination in the next few days.

## 2017-07-22 ENCOUNTER — Encounter: Payer: Self-pay | Admitting: *Deleted

## 2017-07-22 NOTE — Telephone Encounter (Signed)
Bystolic Prior authorization was denied because patient has not tried and failed at least 2 generic beta blockers.    Attempted to reach patient via phone. No answer. Left message to call back. Will also send patient a MyChart message.

## 2017-07-30 NOTE — Telephone Encounter (Signed)
No answer. Left message to call back.   

## 2017-07-31 NOTE — Telephone Encounter (Signed)
Prior authorization not approved. Patient must have tried and failed at least two generic beta blockers. Patient has tried one: propranolol. Patient has been receiving samples since April 2018; however, samples cannot continue to supply. Patient has enough samples for a few weeks. Will route to Dr Rockey Situ for advice and possibly trying another beta blocker.

## 2017-08-03 NOTE — Telephone Encounter (Signed)
If she is needing beta-blocker daily We can try metoprolol succinate 25 mg daily instead of bystolic

## 2017-08-04 MED ORDER — METOPROLOL SUCCINATE ER 25 MG PO TB24
25.0000 mg | ORAL_TABLET | Freq: Every day | ORAL | 3 refills | Status: DC
Start: 2017-08-04 — End: 2018-12-15

## 2017-08-04 NOTE — Telephone Encounter (Signed)
Called patient. She verbalized understanding for the medication change to stop bystolic and start metoprolol succinate 25 mg once a day. She also wanted to schedule an appointment to discuss palpitations with Dr Rockey Situ. We decided to schedule appointment out a few weeks in order to give the metoprolol time to see if this medication works for her. She is scheduled to see Dr Rockey Situ on 09/14/17. Rx sent to pharmacy.

## 2017-08-24 ENCOUNTER — Other Ambulatory Visit: Payer: Self-pay | Admitting: Family Medicine

## 2017-08-26 MED ORDER — LORAZEPAM 0.5 MG PO TABS: 0.5000 mg | ORAL_TABLET | Freq: Two times a day (BID) | ORAL | 0 refills | Status: DC

## 2017-08-26 NOTE — Telephone Encounter (Signed)
rx faxed to [facility] as requested.

## 2017-08-27 ENCOUNTER — Other Ambulatory Visit: Payer: Self-pay | Admitting: Nurse Practitioner

## 2017-08-27 NOTE — Telephone Encounter (Signed)
This Rx has been faxed in twice/thx dmf

## 2017-09-07 ENCOUNTER — Encounter: Payer: Self-pay | Admitting: Family Medicine

## 2017-09-07 ENCOUNTER — Ambulatory Visit (INDEPENDENT_AMBULATORY_CARE_PROVIDER_SITE_OTHER): Payer: 59 | Admitting: Family Medicine

## 2017-09-07 VITALS — BP 132/88 | HR 88 | Temp 98.0°F | Ht 66.0 in | Wt 154.6 lb

## 2017-09-07 DIAGNOSIS — Z01419 Encounter for gynecological examination (general) (routine) without abnormal findings: Secondary | ICD-10-CM | POA: Diagnosis not present

## 2017-09-07 DIAGNOSIS — F411 Generalized anxiety disorder: Secondary | ICD-10-CM

## 2017-09-07 MED ORDER — LEVALBUTEROL HCL 0.63 MG/3ML IN NEBU: 0.6300 mg | INHALATION_SOLUTION | RESPIRATORY_TRACT | 2 refills | Status: DC | PRN

## 2017-09-07 MED ORDER — LORAZEPAM 0.5 MG PO TABS: 0.5000 mg | ORAL_TABLET | Freq: Two times a day (BID) | ORAL | 2 refills | Status: DC

## 2017-09-07 MED ORDER — LEVALBUTEROL TARTRATE 45 MCG/ACT IN AERO: INHALATION_SPRAY | RESPIRATORY_TRACT | 2 refills | Status: DC

## 2017-09-07 NOTE — Patient Instructions (Signed)
Great to see you. Happy New year!

## 2017-09-07 NOTE — Addendum Note (Signed)
Addended by: Lucille Passy on: 09/07/2017 03:53 PM   Modules accepted: Orders

## 2017-09-07 NOTE — Assessment & Plan Note (Signed)
Continue current rxs. 

## 2017-09-07 NOTE — Progress Notes (Signed)
Subjective:   Patient ID: Mackenzie Shea, female    DOB: 30-Dec-1980, 37 y.o.   MRN: [id number]  Mackenzie Shea is a pleasant 37 y.o. year old female who presents to clinic today with Annual Exam (Patient is here today for a CPE.  She had a Complete Hysterectomy on 8.31.18.  She is currently fasting.  She declined the flu shot.)  on 09/07/2017  HPI:  G0 virginal.  Last pap smear done by me on 08/06/15- normal. Remote h/o complete hysterectomy 05/01/17 due to fibroids.  She has four adopted children and is also a foster parent.  Anxiety - compliant with celexa.  Anxiety has been ok.  Still using as needed lorazepam.  Migraines- relatively stable.  No recent migraines.  Lab Results  Component Value Date   CHOL 147 08/04/2016   HDL 54.00 08/04/2016   LDLCALC 82 08/04/2016   TRIG 58.0 08/04/2016   CHOLHDL 3 08/04/2016   Lab Results  Component Value Date   CREATININE 0.85 05/01/2017   Lab Results  Component Value Date   WBC 12.1 (H) 05/01/2017   HGB 10.9 (L) 05/01/2017   HCT 32.2 (L) 05/01/2017   MCV 78.9 05/01/2017   PLT 234 05/01/2017   Lab Results  Component Value Date   TSH 0.76 08/04/2016   Lab Results  Component Value Date   ALT 13 08/04/2016   AST 18 08/04/2016   ALKPHOS 49 08/04/2016   BILITOT 0.3 08/04/2016     Current Outpatient Medications on File Prior to Visit  Medication Sig Dispense Refill  . cetirizine (ZYRTEC) 10 MG tablet Take 10 mg by mouth at bedtime as needed for allergies.     . citalopram (CELEXA) 20 MG tablet TAKE 1 TABLET BY MOUTH ONCE DAILY 30 tablet 5  . fluticasone (VERAMYST) 27.5 MCG/SPRAY nasal spray Place 2 sprays into the nose daily as needed. Reported on 12/22/2015    . ibuprofen (ADVIL,MOTRIN) 600 MG tablet Take 1 tablet (600 mg total) by mouth every 6 (six) hours as needed (mild pain). 30 tablet 0  . metoprolol succinate (TOPROL XL) 25 MG 24 hr tablet Take 1 tablet (25 mg total) by mouth daily. 90 tablet 3  . montelukast  (SINGULAIR) 10 MG tablet TAKE ONE TABLET BY MOUTH AT BEDTIME 90 tablet 1  . Multiple Vitamin (MULTIVITAMIN WITH MINERALS) TABS tablet Take 1 tablet by mouth daily.    Marland Kitchen olopatadine (PATANOL) 0.1 % ophthalmic solution Place 1 drop into both eyes 2 (two) times daily. (Patient taking differently: Place 1 drop into both eyes 2 (two) times daily as needed for allergies. ) 5 mL 5  . ranitidine (ZANTAC) 150 MG tablet Take 150 mg by mouth at bedtime.     . SUMAtriptan (IMITREX) 25 MG tablet Take 2 tablets (50 mg total) by mouth every 2 (two) hours as needed for migraine. 10 tablet 2   No current facility-administered medications on file prior to visit.     Allergies  Allergen Reactions  . Penicillins Hives    Has patient had a PCN reaction causing immediate rash, facial/tongue/throat swelling, SOB or lightheadedness with hypotension: No Has patient had a PCN reaction causing severe rash involving mucus membranes or skin necrosis: No Has patient had a PCN reaction that required hospitalization: No Has patient had a PCN reaction occurring within the last 10 years: No If all of the above answers are "NO", then may proceed with Cephalosporin use.   . Pseudoephedrine Hcl Palpitations  Past Medical History:  Diagnosis Date  . Allergic rhinitis, cause unspecified   . Allergy   . Anemia    history of  . Anxiety   . Elevated blood pressure reading without diagnosis of hypertension   . Esophageal reflux   . Extrinsic asthma, unspecified   . Leukocytosis, unspecified   . Pain in joint, shoulder region   . Personal history of traumatic fracture   . Tachycardia    with anxiety  . Unspecified vitamin D deficiency     Past Surgical History:  Procedure Laterality Date  . ABDOMINAL HYSTERECTOMY  05/01/2017   Dr. Hazle Coca @ [facility] (Complete)  . HYSTERECTOMY ABDOMINAL WITH SALPINGECTOMY Bilateral 04/30/2017   Procedure: HYSTERECTOMY ABDOMINAL WITH SALPINGECTOMY;  Surgeon: Dian Queen, MD;  Location: [facility];  Service: Gynecology;  Laterality: Bilateral;    Family History  Problem Relation Age of Onset  . Heart disease Father     Social History   Socioeconomic History  . Marital status: Married    Spouse name: Not on file  . Number of children: Not on file  . Years of education: Not on file  . Highest education level: Not on file  Social Needs  . Financial resource strain: Not on file  . Food insecurity - worry: Not on file  . Food insecurity - inability: Not on file  . Transportation needs - medical: Not on file  . Transportation needs - non-medical: Not on file  Occupational History  . Not on file  Tobacco Use  . Smoking status: Never Smoker  . Smokeless tobacco: Never Used  Substance and Sexual Activity  . Alcohol use: No  . Drug use: No  . Sexual activity: Yes    Birth control/protection: None  Other Topics Concern  . Not on file  Social History Narrative  . Not on file   The PMH, PSH, Social History, Family History, Medications, and allergies have been reviewed in [facility], and have been updated if relevant.   Review of Systems  Constitutional: Negative.   HENT: Negative.   Eyes: Negative.   Respiratory: Negative.   Cardiovascular: Negative.   Gastrointestinal: Negative.   Endocrine: Negative.   Genitourinary: Negative.   Musculoskeletal: Negative.   Allergic/Immunologic: Negative.   Neurological: Negative.   Hematological: Negative.   Psychiatric/Behavioral: Negative.   All other systems reviewed and are negative.      Objective:    BP 132/88 (BP Location: Left Arm, Patient Position: Sitting, Cuff Size: Normal)   Pulse 88   Temp 98 F (36.7 C) (Mackenzie)   Ht 5\' 6"  (1.676 m)   Wt 154 lb 9.6 oz (70.1 kg)   SpO2 99%   BMI 24.95 kg/m    Physical Exam   General:  Well-developed,well-nourished,in no acute distress; alert,appropriate and cooperative throughout examination Head:  normocephalic and atraumatic.   Eyes:   vision grossly intact, PERRL Ears:  R ear normal and L ear normal externally, TMs clear bilaterally Nose:  no external deformity.   Mouth:  good dentition.   Neck:  No deformities, masses, or tenderness noted. Breasts:  No mass, nodules, thickening, tenderness, bulging, retraction, inflamation, nipple discharge or skin changes noted.   Lungs:  Normal respiratory effort, chest expands symmetrically. Lungs are clear to auscultation, no crackles or wheezes. Heart:  Normal rate and regular rhythm. S1 and S2 normal without gallop, murmur, click, rub or other extra sounds. Abdomen:  Bowel sounds positive,abdomen soft and non-tender without masses, organomegaly or hernias  noted. Msk:  No deformity or scoliosis noted of thoracic or lumbar spine.   Extremities:  No clubbing, cyanosis, edema, or deformity noted with normal full range of motion of all joints.   Neurologic:  alert & oriented X3 and gait normal.   Skin:  Intact without suspicious lesions or rashes Cervical Nodes:  No lymphadenopathy noted Axillary Nodes:  No palpable lymphadenopathy Psych:  Cognition and judgment appear intact. Alert and cooperative with normal attention span and concentration. No apparent delusions, illusions, hallucinations         Assessment & Plan:   Well woman exam - Plan: CBC with Differential/Platelet, Comprehensive metabolic panel, Lipid panel, TSH No Follow-up on file.

## 2017-09-07 NOTE — Assessment & Plan Note (Signed)
Reviewed preventive care protocols, scheduled due services, and updated immunizations Discussed nutrition, exercise, diet, and healthy lifestyle.  Orders Placed This Encounter  Procedures  . CBC with Differential/Platelet  . Comprehensive metabolic panel  . Lipid panel  . TSH     

## 2017-09-08 LAB — CBC WITH DIFFERENTIAL/PLATELET
BASOS ABS: 0.1 10*3/uL (ref 0.0–0.1)
BASOS PCT: 0.7 % (ref 0.0–3.0)
Eosinophils Absolute: 0.2 10*3/uL (ref 0.0–0.7)
Eosinophils Relative: 1.5 % (ref 0.0–5.0)
HEMATOCRIT: 44.8 % (ref 36.0–46.0)
HEMOGLOBIN: 14.2 g/dL (ref 12.0–15.0)
LYMPHS PCT: 31.3 % (ref 12.0–46.0)
Lymphs Abs: 3.5 10*3/uL (ref 0.7–4.0)
MCHC: 31.8 g/dL (ref 30.0–36.0)
MCV: 80.9 fl (ref 78.0–100.0)
MONOS PCT: 6.6 % (ref 3.0–12.0)
Monocytes Absolute: 0.7 10*3/uL (ref 0.1–1.0)
NEUTROS ABS: 6.8 10*3/uL (ref 1.4–7.7)
Neutrophils Relative %: 59.9 % (ref 43.0–77.0)
PLATELETS: 299 10*3/uL (ref 150.0–400.0)
RBC: 5.54 Mil/uL — ABNORMAL HIGH (ref 3.87–5.11)
RDW: 14 % (ref 11.5–15.5)
WBC: 11.3 10*3/uL — ABNORMAL HIGH (ref 4.0–10.5)

## 2017-09-08 LAB — LIPID PANEL
CHOL/HDL RATIO: 3
CHOLESTEROL: 184 mg/dL (ref 0–200)
HDL: 61 mg/dL (ref >39.00)
LDL Cholesterol: 105 mg/dL — ABNORMAL HIGH (ref 0–99)
NonHDL: 122.76
TRIGLYCERIDES: 88 mg/dL (ref 0.0–149.0)
VLDL: 17.6 mg/dL (ref 0.0–40.0)

## 2017-09-08 LAB — COMPREHENSIVE METABOLIC PANEL
ALBUMIN: 4.3 g/dL (ref 3.5–5.2)
ALT: 15 U/L (ref 0–35)
AST: 19 U/L (ref 0–37)
Alkaline Phosphatase: 56 U/L (ref 39–117)
BILIRUBIN TOTAL: 0.4 mg/dL (ref 0.2–1.2)
BUN: 9 mg/dL (ref 6–23)
CALCIUM: 9.6 mg/dL (ref 8.4–10.5)
CO2: 27 meq/L (ref 19–32)
Chloride: 102 mEq/L (ref 96–112)
Creatinine, Ser: 0.84 mg/dL (ref 0.40–1.20)
GFR: 97.74 mL/min (ref >60.00)
Glucose, Bld: 84 mg/dL (ref 70–99)
Potassium: 4.4 mEq/L (ref 3.5–5.1)
Sodium: 136 mEq/L (ref 135–145)
Total Protein: 7.1 g/dL (ref 6.0–8.3)

## 2017-09-08 LAB — TSH: TSH: 0.99 u[IU]/mL (ref 0.35–4.50)

## 2017-09-08 LAB — VITAMIN D 25 HYDROXY (VIT D DEFICIENCY, FRACTURES): VITD: 39.88 ng/mL (ref 30.00–100.00)

## 2017-09-12 DIAGNOSIS — I479 Paroxysmal tachycardia, unspecified: Secondary | ICD-10-CM | POA: Insufficient documentation

## 2017-09-12 NOTE — Progress Notes (Deleted)
Cardiology Office Note  Date:  09/12/2017   ID:  Mackenzie Shea, Mackenzie Shea 11/19/1980, MRN [id number]  PCP:  Mackenzie Passy, MD   No chief complaint on file.   HPI:  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 Had lightheadedness, symptoms seem to persist for long time Developed Vertigo in 12/2015, Required treatment, symptoms lasted 1 month She reported that her Anxiety Was getting worse and was started on Zoloft Started having Migraines After she Adopted 4 children From one family, approximately 3 years ago, stress at home has been significant. 37 year old girl is going through adolescence She weaned herself off Zoloft as she felt this might be having side effects including tachycardia  Now with heartburn, tried PPI Periodic asthma Sometimes with poor sleep hygiene  She is concerned about her heart rate typically runs 90, more recently 110 or higher She has palpations at night, sometimes feels like it is stopping and starting. Does not feel these irregular beats during the daytime as much. Reports that she was previously on propranolol, does not have a new prescription. This seemed to work okay before but she did not needed for very long.  Reports that she does take Ativan as needed, this has helped to relieve some of her stress.  EKG today shows Sinus tachycardia with rate 121 beats per minute with no significant ST or T wave changes   PMH:   has a past medical history of Allergic rhinitis, cause unspecified, Allergy, Anemia, Anxiety, Elevated blood pressure reading without diagnosis of hypertension, Esophageal reflux, Extrinsic asthma, unspecified, Leukocytosis,  unspecified, Pain in joint, shoulder region, Personal history of traumatic fracture, Tachycardia, and Unspecified vitamin D deficiency.  PSH:    Past Surgical History:  Procedure Laterality Date  . ABDOMINAL HYSTERECTOMY  05/01/2017   Dr. Hazle Shea @ [facility] (Complete)  . HYSTERECTOMY ABDOMINAL WITH SALPINGECTOMY Bilateral 04/30/2017   Procedure: HYSTERECTOMY ABDOMINAL WITH SALPINGECTOMY;  Surgeon: Mackenzie Queen, MD;  Location: [facility];  Service: Gynecology;  Laterality: Bilateral;    Current Outpatient Medications  Medication Sig Dispense Refill  . cetirizine (ZYRTEC) 10 MG tablet Take 10 mg by mouth at bedtime as needed for allergies.     . citalopram (CELEXA) 20 MG tablet TAKE 1 TABLET BY MOUTH ONCE DAILY 30 tablet 5  . fluticasone (VERAMYST) 27.5 MCG/SPRAY nasal spray Place 2 sprays into the nose daily as needed. Reported on 12/22/2015    . ibuprofen (ADVIL,MOTRIN) 600 MG tablet Take 1 tablet (600 mg total) by mouth every 6 (six) hours as needed (mild pain). 30 tablet 0  . levalbuterol (XOPENEX HFA) 45 MCG/ACT inhaler INHALE ONE TO TWO PUFFS INTO LUNGS EVERY 4 HOURS AS NEEDED 15 g 2  . levalbuterol (XOPENEX) 0.63 MG/3ML nebulizer solution Take 3 mLs (0.63 mg total) by nebulization every 4 (four) hours as needed for wheezing or shortness of breath. 3 mL 2  . LORazepam (ATIVAN) 0.5 MG tablet Take 1 tablet (0.5 mg total) by mouth 2 (two) times daily. 30 tablet 2  . metoprolol succinate (TOPROL XL) 25 MG 24 hr tablet Take 1 tablet (25 mg total) by mouth daily. [place]  tablet 3  . montelukast (SINGULAIR) 10 MG tablet TAKE ONE TABLET BY MOUTH AT BEDTIME 90 tablet 1  . Multiple Vitamin (MULTIVITAMIN WITH MINERALS) TABS tablet Take 1 tablet by mouth daily.    Marland Kitchen olopatadine (PATANOL) 0.1 % ophthalmic solution Place 1 drop into both eyes 2 (two) times daily. (Patient taking differently: Place 1 drop into both eyes 2 (two) times daily as needed for allergies. ) 5 mL 5  . ranitidine (ZANTAC)  150 MG tablet Take 150 mg by mouth at bedtime.     . SUMAtriptan (IMITREX) 25 MG tablet Take 2 tablets (50 mg total) by mouth every 2 (two) hours as needed for migraine. 10 tablet 2   No current facility-administered medications for this visit.      Allergies:   Penicillins and Pseudoephedrine hcl   Social History:  The patient  reports that  has never smoked. she has never used smokeless tobacco. She reports that she does not drink alcohol or use drugs.   Family History:   family history includes Heart disease in her father.    Review of Systems: Review of Systems  Constitutional: Negative.   Respiratory: Negative.   Cardiovascular: Positive for palpitations.       Tachycardia  Gastrointestinal: Negative.   Musculoskeletal: Negative.   Neurological: Negative.   Psychiatric/Behavioral: The patient is nervous/anxious.   All other systems reviewed and are negative.    PHYSICAL EXAM: VS:  LMP 04/30/2017 (Exact Date)  , BMI There is no height or weight on file to calculate BMI. GEN: Well nourished, well developed, in no acute distress  HEENT: normal  Neck: no JVD, carotid bruits, or masses Cardiac: RRR; no murmurs, rubs, or gallops,no edema  Respiratory:  clear to auscultation bilaterally, normal work of breathing GI: soft, nontender, nondistended, + BS MS: no deformity or atrophy  Skin: warm and dry, no rash Neuro:  Strength and sensation are intact Psych: euthymic mood, full affect   Recent Labs: 09/07/2017: ALT 15; BUN 9; Creatinine, Ser 0.84; Hemoglobin 14.2; Platelets 299.0; Potassium 4.4; Sodium 136; TSH 0.99    Lipid Panel Lab Results  Component Value Date   CHOL 184 09/07/2017   HDL 61.00 09/07/2017   LDLCALC 105 (H) 09/07/2017   TRIG 88.0 09/07/2017      Wt Readings from Last 3 Encounters:  09/07/17 154 lb 9.6 oz (70.1 kg)  04/21/17 166 lb (75.3 kg)  04/20/17 166 lb 4 oz (75.4 kg)       ASSESSMENT AND PLAN: Essential hypertension Given her high  blood pressure, tachycardia, recommended that she start bystolic 5 mg daily. We may need to titrate upwards To 10 mg for rate control.   Tachycardia - Plan: EKG 12-Lead Long discussion concerning various treatment options for her tachycardia Long history of chronic sinus tachycardia likely exacerbated by anxiety. Discussed various treatment options for her. She could take beta blocker as needed for symptomatic palpitations, with propranolol. Other option would be to take beta blocker daily With bystolic 5 mg up to 10 mg as detailed above. She potentially could even take bystolic 5 mg daily with propranolol for breakthrough  Anxiety state She previously stopped sertraline Given significant anxiety, she may need all medication Significant home stress With 4 young children  Palpitations Likely having APCs or PVCs. Discussed this with her, likely of little significance. Solitary Abnormal beats may improve with low-dose beta blocker    Total encounter time more than 60 minutes  Greater than 50% was spent in  counseling and coordination of care with the patient  [name] was seen in consultation for Dr. Deborra Medina and will be referred back for ongoing care of the issues detailed above   Disposition:   F/U  As needed   No orders of the defined types were placed in this encounter.    Signed, Esmond Plants, M.D., Ph.D. 09/12/2017  [place], [place]

## 2017-09-14 ENCOUNTER — Ambulatory Visit: Payer: 59 | Admitting: Cardiovascular Disease

## 2017-10-13 ENCOUNTER — Ambulatory Visit: Payer: 59 | Admitting: Cardiovascular Disease

## 2017-11-01 ENCOUNTER — Other Ambulatory Visit: Payer: Self-pay | Admitting: Family Medicine

## 2017-11-02 ENCOUNTER — Other Ambulatory Visit: Payer: Self-pay

## 2017-11-02 ENCOUNTER — Encounter: Payer: Self-pay | Admitting: Family Medicine

## 2017-11-02 MED ORDER — MONTELUKAST SODIUM 10 MG PO TABS: 10.0000 mg | ORAL_TABLET | Freq: Every day | ORAL | 1 refills | Status: DC

## 2018-01-15 ENCOUNTER — Encounter: Payer: Self-pay | Admitting: Family Medicine

## 2018-01-15 MED ORDER — LORAZEPAM 0.5 MG PO TABS: 0.5000 mg | ORAL_TABLET | Freq: Two times a day (BID) | ORAL | 0 refills | Status: DC

## 2018-03-16 ENCOUNTER — Encounter: Payer: Self-pay | Admitting: Family Medicine

## 2018-04-30 ENCOUNTER — Ambulatory Visit: Payer: 59 | Admitting: Nurse Practitioner

## 2018-04-30 ENCOUNTER — Ambulatory Visit (INDEPENDENT_AMBULATORY_CARE_PROVIDER_SITE_OTHER): Payer: 59 | Admitting: Family Medicine

## 2018-04-30 ENCOUNTER — Encounter: Payer: Self-pay | Admitting: Family Medicine

## 2018-04-30 VITALS — BP 130/80 | HR 109 | Temp 99.0°F | Ht 66.0 in

## 2018-04-30 DIAGNOSIS — L03811 Cellulitis of head [any part, except face]: Secondary | ICD-10-CM | POA: Diagnosis not present

## 2018-04-30 MED ORDER — CEPHALEXIN 500 MG PO CAPS: 500.0000 mg | ORAL_CAPSULE | Freq: Three times a day (TID) | ORAL | 0 refills | Status: AC

## 2018-04-30 NOTE — Progress Notes (Signed)
Subjective:  Patient ID: Mackenzie Shea, female    DOB: 20-Oct-1980  Age: 37 y.o. MRN: [id number]  CC: ear infected?   HPI Mackenzie Shea presents for evaluation of pain and swelling in the right tragus on Tuesday status post piercing on Monday.  Piercing was removed yesterday.  The tragus is now swollen and inflamed and tender.  There is been some drainage.  Denies fever or chills.  She has a history of rash with penicillin.  She has taken amoxicillin and Keflex in the past without issue.  There is some pain in the right TMJ area when she opens her mouth.  She has an apparent nickel allergy.  Outpatient Medications Prior to Visit  Medication Sig Dispense Refill  . cetirizine (ZYRTEC) 10 MG tablet Take 10 mg by mouth at bedtime as needed for allergies.     . citalopram (CELEXA) 20 MG tablet TAKE 1 TABLET BY MOUTH ONCE DAILY 30 tablet 5  . fluticasone (VERAMYST) 27.5 MCG/SPRAY nasal spray Place 2 sprays into the nose daily as needed. Reported on 12/22/2015    . ibuprofen (ADVIL,MOTRIN) 600 MG tablet Take 1 tablet (600 mg total) by mouth every 6 (six) hours as needed (mild pain). 30 tablet 0  . levalbuterol (XOPENEX HFA) 45 MCG/ACT inhaler INHALE ONE TO TWO PUFFS INTO LUNGS EVERY 4 HOURS AS NEEDED 15 g 2  . levalbuterol (XOPENEX) 0.63 MG/3ML nebulizer solution Take 3 mLs (0.63 mg total) by nebulization every 4 (four) hours as needed for wheezing or shortness of breath. 3 mL 2  . LORazepam (ATIVAN) 0.5 MG tablet Take 1 tablet (0.5 mg total) by mouth 2 (two) times daily. 30 tablet 0  . metoprolol succinate (TOPROL XL) 25 MG 24 hr tablet Take 1 tablet (25 mg total) by mouth daily. 90 tablet 3  . montelukast (SINGULAIR) 10 MG tablet Take 1 tablet (10 mg total) by mouth at bedtime. 90 tablet 1  . Multiple Vitamin (MULTIVITAMIN WITH MINERALS) TABS tablet Take 1 tablet by mouth daily.    Marland Kitchen olopatadine (PATANOL) 0.1 % ophthalmic solution Place 1 drop into both eyes 2 (two) times daily. (Patient taking  differently: Place 1 drop into both eyes 2 (two) times daily as needed for allergies. ) 5 mL 5  . ranitidine (ZANTAC) 150 MG tablet Take 150 mg by mouth at bedtime.     . SUMAtriptan (IMITREX) 25 MG tablet Take 2 tablets (50 mg total) by mouth every 2 (two) hours as needed for migraine. 10 tablet 2   No facility-administered medications prior to visit.     ROS Review of Systems  Constitutional: Negative for chills, diaphoresis, fever and unexpected weight change.  HENT: Negative for sore throat, trouble swallowing and voice change.   Eyes: Negative for photophobia and visual disturbance.  Respiratory: Negative.   Cardiovascular: Negative.   Gastrointestinal: Negative.   Musculoskeletal: Negative for arthralgias and myalgias.  Skin: Positive for color change and rash.  Neurological: Negative for speech difficulty.  Hematological: Does not bruise/bleed easily.  Psychiatric/Behavioral: Negative.     Objective:  BP 130/80   Pulse (!) 109   Temp 99 F (37.2 C)   Ht 5\' 6"  (1.676 m)   LMP 04/30/2017 (Exact Date)   SpO2 98%   BMI 24.95 kg/m   BP Readings from Last 3 Encounters:  04/30/18 130/80  09/07/17 132/88  05/01/17 116/70    Wt Readings from Last 3 Encounters:  09/07/17 154 lb 9.6 oz (70.1 kg)  04/21/17  166 lb (75.3 kg)  04/20/17 166 lb 4 oz (75.4 kg)    Physical Exam  Constitutional: She is oriented to person, place, and time. She appears well-developed and well-nourished. No distress.  HENT:  Head: Normocephalic and atraumatic.    Left Ear: External ear normal.  Eyes: Right eye exhibits no discharge. Left eye exhibits no discharge. No scleral icterus.  Neck: Normal range of motion. Neck supple.  Pulmonary/Chest: Effort normal.  Neurological: She is alert and oriented to person, place, and time.  Skin: Skin is warm. She is not diaphoretic. There is erythema.  Psychiatric: She has a normal mood and affect. Her behavior is normal.    Lab Results  Component  Value Date   WBC 11.3 (H) 09/07/2017   HGB 14.2 09/07/2017   HCT 44.8 09/07/2017   PLT 299.0 09/07/2017   GLUCOSE 84 09/07/2017   CHOL 184 09/07/2017   TRIG 88.0 09/07/2017   HDL 61.00 09/07/2017   LDLCALC 105 (H) 09/07/2017   ALT 15 09/07/2017   AST 19 09/07/2017   NA 136 09/07/2017   K 4.4 09/07/2017   CL 102 09/07/2017   CREATININE 0.84 09/07/2017   BUN 9 09/07/2017   CO2 27 09/07/2017   TSH 0.99 09/07/2017    No results found.  Assessment & Plan:   Mackenzie Shea was seen today for ear infected?.  Diagnoses and all orders for this visit:  Cellulitis of head except face -     Wound culture  Other orders -     cephALEXin (KEFLEX) 500 MG capsule; Take 1 capsule (500 mg total) by mouth 3 (three) times daily for 10 days.   I am having Mackenzie Shea start on cephALEXin. I am also having her maintain her fluticasone, cetirizine, olopatadine, SUMAtriptan, ranitidine, multivitamin with minerals, ibuprofen, citalopram, metoprolol succinate, levalbuterol, levalbuterol, montelukast, and LORazepam.  Meds ordered this encounter  Medications  . cephALEXin (KEFLEX) 500 MG capsule    Sig: Take 1 capsule (500 mg total) by mouth 3 (three) times daily for 10 days.    Dispense:  30 capsule    Refill:  0     Follow-up: Return in about 3 days (around 05/03/2018), or if symptoms worsen or fail to improve.  He will apply warm compresses and use Tylenol and Advil as needed.  Follow-up in 3 days if not improving.  Libby Maw, MD

## 2018-04-30 NOTE — Patient Instructions (Signed)
Cellulitis, Adult Cellulitis is a skin infection. The infected area is usually red and tender. This condition occurs most often in the arms and lower legs. The infection can travel to the muscles, blood, and underlying tissue and become serious. It is very important to get treated for this condition. What are the causes? Cellulitis is caused by bacteria. The bacteria enter through a break in the skin, such as a cut, burn, insect bite, open sore, or crack. What increases the risk? This condition is more likely to occur in people who:  Have a weak defense system (immune system).  Have open wounds on the skin such as cuts, burns, bites, and scrapes. Bacteria can enter the body through these open wounds.  Are older.  Have diabetes.  Have a type of long-lasting (chronic) liver disease (cirrhosis) or kidney disease.  Use IV drugs.  What are the signs or symptoms? Symptoms of this condition include:  Redness, streaking, or spotting on the skin.  Swollen area of the skin.  Tenderness or pain when an area of the skin is touched.  Warm skin.  Fever.  Chills.  Blisters.  How is this diagnosed? This condition is diagnosed based on a medical history and physical exam. You may also have tests, including:  Blood tests.  Lab tests.  Imaging tests.  How is this treated? Treatment for this condition may include:  Medicines, such as antibiotic medicines or antihistamines.  Supportive care, such as rest and application of cold or warm cloths (cold or warm compresses) to the skin.  Hospital care, if the condition is severe.  The infection usually gets better within 1-2 days of treatment. Follow these instructions at home:  Take over-the-counter and prescription medicines only as told by your health care provider.  If you were prescribed an antibiotic medicine, take it as told by your health care provider. Do not stop taking the antibiotic even if you start to feel  better.  Drink enough fluid to keep your urine clear or pale yellow.  Do not touch or rub the infected area.  Raise (elevate) the infected area above the level of your heart while you are sitting or lying down.  Apply warm or cold compresses to the affected area as told by your health care provider.  Keep all follow-up visits as told by your health care provider. This is important. These visits let your health care provider make sure a more serious infection is not developing. Contact a health care provider if:  You have a fever.  Your symptoms do not improve within 1-2 days of starting treatment.  Your bone or joint underneath the infected area becomes painful after the skin has healed.  Your infection returns in the same area or another area.  You notice a swollen bump in the infected area.  You develop new symptoms.  You have a general ill feeling (malaise) with muscle aches and pains. Get help right away if:  Your symptoms get worse.  You feel very sleepy.  You develop vomiting or diarrhea that persists.  You notice red streaks coming from the infected area.  Your red area gets larger or turns dark in color. This information is not intended to replace advice given to you by your health care provider. Make sure you discuss any questions you have with your health care provider. Document Released: 05/28/2005 Document Revised: 12/27/2015 Document Reviewed: 06/27/2015 Elsevier Interactive Patient Education  2018 [employer]. Cephalexin tablets or capsules What is this medicine? CEPHALEXIN (sef  a LEX in) is a cephalosporin antibiotic. It is used to treat certain kinds of bacterial infections It will not work for colds, flu, or other viral infections. This medicine may be used for other purposes; ask your health care provider or pharmacist if you have questions. COMMON BRAND NAME(S): Biocef, Daxbia, Keflex, Keftab What should I tell my health care provider before I take  this medicine? They need to know if you have any of these conditions: -kidney disease -stomach or intestine problems, especially colitis -an unusual or allergic reaction to cephalexin, other cephalosporins, penicillins, other antibiotics, medicines, foods, dyes or preservatives -pregnant or trying to get pregnant -breast-feeding How should I use this medicine? Take this medicine by mouth with a full glass of water. Follow the directions on the prescription label. This medicine can be taken with or without food. Take your medicine at regular intervals. Do not take your medicine more often than directed. Take all of your medicine as directed even if you think you are better. Do not skip doses or stop your medicine early. Talk to your pediatrician regarding the use of this medicine in children. While this drug may be prescribed for selected conditions, precautions do apply. Overdosage: If you think you have taken too much of this medicine contact a poison control center or emergency room at once. NOTE: This medicine is only for you. Do not share this medicine with others. What if I miss a dose? If you miss a dose, take it as soon as you can. If it is almost time for your next dose, take only that dose. Do not take double or extra doses. There should be at least 4 to 6 hours between doses. What may interact with this medicine? -probenecid -some other antibiotics This list may not describe all possible interactions. Give your health care provider a list of all the medicines, herbs, non-prescription drugs, or dietary supplements you use. Also tell them if you smoke, drink alcohol, or use illegal drugs. Some items may interact with your medicine. What should I watch for while using this medicine? Tell your doctor or health care professional if your symptoms do not begin to improve in a few days. Do not treat diarrhea with over the counter products. Contact your doctor if you have diarrhea that lasts  more than 2 days or if it is severe and watery. If you have diabetes, you may get a false-positive result for sugar in your urine. Check with your doctor or health care professional. What side effects may I notice from receiving this medicine? Side effects that you should report to your doctor or health care professional as soon as possible: -allergic reactions like skin rash, itching or hives, swelling of the face, lips, or tongue -breathing problems -pain or trouble passing urine -redness, blistering, peeling or loosening of the skin, including inside the mouth -severe or watery diarrhea -unusually weak or tired -yellowing of the eyes, skin Side effects that usually do not require medical attention (report to your doctor or health care professional if they continue or are bothersome): -gas or heartburn -genital or anal irritation -headache -joint or muscle pain -nausea, vomiting This list may not describe all possible side effects. Call your doctor for medical advice about side effects. You may report side effects to FDA at 1-800-FDA-1088. Where should I keep my medicine? Keep out of the reach of children. Store at room temperature between 59 and 86 degrees F (15 and 30 degrees C). Throw away any unused medicine after  the expiration date. NOTE: This sheet is a summary. It may not cover all possible information. If you have questions about this medicine, talk to your doctor, pharmacist, or health care provider.  2018 Elsevier/Gold Standard (2007-11-22 17:09:13)

## 2018-05-03 LAB — WOUND CULTURE
MICRO NUMBER:: 91041545
SPECIMEN QUALITY: ADEQUATE

## 2018-05-04 ENCOUNTER — Encounter: Payer: Self-pay | Admitting: Family Medicine

## 2018-05-13 ENCOUNTER — Other Ambulatory Visit: Payer: Self-pay | Admitting: Family Medicine

## 2018-05-14 ENCOUNTER — Encounter: Payer: Self-pay | Admitting: Family Medicine

## 2018-05-17 ENCOUNTER — Other Ambulatory Visit: Payer: Self-pay

## 2018-05-17 MED ORDER — MONTELUKAST SODIUM 10 MG PO TABS: 10.0000 mg | ORAL_TABLET | Freq: Every day | ORAL | 2 refills | Status: DC

## 2018-05-17 MED ORDER — CITALOPRAM HYDROBROMIDE 20 MG PO TABS: 20.0000 mg | ORAL_TABLET | Freq: Every day | ORAL | 2 refills | Status: DC

## 2018-12-14 ENCOUNTER — Encounter: Payer: Self-pay | Admitting: Family Medicine

## 2018-12-15 ENCOUNTER — Ambulatory Visit (INDEPENDENT_AMBULATORY_CARE_PROVIDER_SITE_OTHER): Payer: 59 | Admitting: Family Medicine

## 2018-12-15 VITALS — Temp 97.9°F | Ht 66.0 in | Wt 168.0 lb

## 2018-12-15 DIAGNOSIS — J4540 Moderate persistent asthma, uncomplicated: Secondary | ICD-10-CM

## 2018-12-15 DIAGNOSIS — F411 Generalized anxiety disorder: Secondary | ICD-10-CM | POA: Diagnosis not present

## 2018-12-15 MED ORDER — LEVALBUTEROL TARTRATE 45 MCG/ACT IN AERO: INHALATION_SPRAY | RESPIRATORY_TRACT | 2 refills | Status: DC

## 2018-12-15 MED ORDER — LORAZEPAM 0.5 MG PO TABS: 0.5000 mg | ORAL_TABLET | Freq: Two times a day (BID) | ORAL | 2 refills | Status: DC

## 2018-12-15 MED ORDER — MONTELUKAST SODIUM 10 MG PO TABS: 10.0000 mg | ORAL_TABLET | Freq: Every day | ORAL | 2 refills | Status: DC

## 2018-12-15 MED ORDER — LEVALBUTEROL HCL 0.63 MG/3ML IN NEBU: 0.6300 mg | INHALATION_SOLUTION | RESPIRATORY_TRACT | 2 refills | Status: DC | PRN

## 2018-12-15 NOTE — Assessment & Plan Note (Signed)
Deteriorated.  Recently moved- more trees, she is allergic to pollen.  Has not had to use the nebulizer. She is taking singulair daily and xopenex inhaler once or twice daily for few weeks which is typical for her this time year. eRx refills sent. No changes made to rx.

## 2018-12-15 NOTE — Progress Notes (Signed)
Virtual Visit via Video   I connected with Mackenzie Shea on 12/15/18 at  9:20 AM EDT by a video enabled telemedicine application and verified that I am speaking with the correct person using two identifiers. Location patient: Home Location provider: [facility], Office Persons participating in the virtual visit: Seabron Spates, MD   I discussed the limitations of evaluation and management by telemedicine and the availability of in person appointments. The patient expressed understanding and agreed to proceed.  Subjective:   HPI:   Follow up-  Alllergic rhinitis with reactive airway disease- feels singular with as needed xopenex is working well.  Anxiety- Has been under control.  Has not been taking celexa 20 mg daily.  Does take as needed Ativan but uses it sparingly.  GAD/ [place] PMP viewed pt is compliant/last fill of Lorazepam 5.2019.  GAD 7 : Generalized Anxiety Score 12/15/2018  Nervous, Anxious, on Edge 1  Control/stop worrying 0  Worry too much - different things 3  Trouble relaxing 0  Restless 0  Easily annoyed or irritable 0  Afraid - awful might happen 0  Total GAD 7 Score 4  Anxiety Difficulty Not difficult at all   Depression screen [facility] 2/9 12/15/2018 09/07/2017 12/22/2015 08/06/2015  Decreased Interest 0 0 0 0  Down, Depressed, Hopeless 0 0 0 0  PHQ - 2 Score 0 0 0 0      Review of Systems  Constitutional: Negative.   Respiratory: Positive for cough and wheezing.   Psychiatric/Behavioral: Negative for agitation, behavioral problems, confusion, decreased concentration, dysphoric mood, hallucinations, self-injury, sleep disturbance and suicidal ideas. The patient is not nervous/anxious and is not hyperactive.   All other systems reviewed and are negative.    ROS: See pertinent positives and negatives per HPI.  Patient Active Problem List   Diagnosis Date Noted  . Cellulitis of head except face 04/30/2018  . Paroxysmal tachycardia ([place])  09/12/2017  . S/P TAH (total abdominal hysterectomy) 04/30/2017  . Palpitations 12/10/2016  . Essential hypertension 12/10/2016  . Well woman exam 08/07/2016  . Migraine headache 08/07/2016  . Anxiety state 01/21/2016  . Anemia, iron deficiency 04/06/2014  . Asthma, chronic 12/28/2012  . Vitamin D deficiency 10/02/2011    Social History   Tobacco Use  . Smoking status: Never Smoker  . Smokeless tobacco: Never Used  Substance Use Topics  . Alcohol use: No    Current Outpatient Medications:  .  cetirizine (ZYRTEC) 10 MG tablet, Take 10 mg by mouth at bedtime as needed for allergies. , Disp: , Rfl:  .  citalopram (CELEXA) 20 MG tablet, Take 1 tablet (20 mg total) by mouth daily., Disp: 90 tablet, Rfl: 2 .  fluticasone (VERAMYST) 27.5 MCG/SPRAY nasal spray, Place 2 sprays into the nose daily as needed. Reported on 12/22/2015, Disp: , Rfl:  .  ibuprofen (ADVIL,MOTRIN) 600 MG tablet, Take 1 tablet (600 mg total) by mouth every 6 (six) hours as needed (mild pain)., Disp: 30 tablet, Rfl: 0 .  levalbuterol (XOPENEX HFA) 45 MCG/ACT inhaler, INHALE ONE TO TWO PUFFS INTO LUNGS EVERY 4 HOURS AS NEEDED, Disp: 15 g, Rfl: 2 .  levalbuterol (XOPENEX) 0.63 MG/3ML nebulizer solution, Take 3 mLs (0.63 mg total) by nebulization every 4 (four) hours as needed for wheezing or shortness of breath., Disp: 3 mL, Rfl: 2 .  LORazepam (ATIVAN) 0.5 MG tablet, Take 1 tablet (0.5 mg total) by mouth 2 (two) times daily., Disp: 30 tablet, Rfl: 0 .  metoprolol succinate (TOPROL XL) 25 MG 24 hr tablet, Take 1 tablet (25 mg total) by mouth daily., Disp: 90 tablet, Rfl: 3 .  montelukast (SINGULAIR) 10 MG tablet, Take 1 tablet (10 mg total) by mouth at bedtime., Disp: 90 tablet, Rfl: 2 .  Multiple Vitamin (MULTIVITAMIN WITH MINERALS) TABS tablet, Take 1 tablet by mouth daily., Disp: , Rfl:  .  olopatadine (PATANOL) 0.1 % ophthalmic solution, Place 1 drop into both eyes 2 (two) times daily. (Patient taking differently: Place  1 drop into both eyes 2 (two) times daily as needed for allergies. ), Disp: 5 mL, Rfl: 5 .  ranitidine (ZANTAC) 150 MG tablet, Take 150 mg by mouth at bedtime. , Disp: , Rfl:  .  SUMAtriptan (IMITREX) 25 MG tablet, Take 2 tablets (50 mg total) by mouth every 2 (two) hours as needed for migraine., Disp: 10 tablet, Rfl: 2  Allergies  Allergen Reactions  . Penicillins Hives    Has patient had a PCN reaction causing immediate rash, facial/tongue/throat swelling, SOB or lightheadedness with hypotension: No Has patient had a PCN reaction causing severe rash involving mucus membranes or skin necrosis: No Has patient had a PCN reaction that required hospitalization: No Has patient had a PCN reaction occurring within the last 10 years: No If all of the above answers are "NO", then may proceed with Cephalosporin use.   . Pseudoephedrine Hcl Palpitations    Objective:  LMP 04/30/2017 (Exact Date)   VITALS: Per patient if applicable, see vitals. GENERAL: Alert, appears well and in no acute distress. HEENT: Atraumatic, conjunctiva clear, no obvious abnormalities on inspection of external nose and ears. NECK: Normal movements of the head and neck. CARDIOPULMONARY: No increased WOB. Speaking in clear sentences. I:E ratio WNL.  MS: Moves all visible extremities without noticeable abnormality. PSYCH: Pleasant and cooperative, well-groomed. Speech normal rate and rhythm. Affect is appropriate. Insight and judgement are appropriate. Attention is focused, linear, and appropriate.  NEURO: CN grossly intact. Oriented as arrived to appointment on time with no prompting. Moves both UE equally.  SKIN: No obvious lesions, wounds, erythema, or cyanosis noted on face or hands.  Assessment and Plan:   There are no diagnoses linked to this encounter.  . Reviewed expectations re: course of current medical issues. . Discussed self-management of symptoms. . Outlined signs and symptoms indicating need for more  acute intervention. . Patient verbalized understanding and all questions were answered. Marland Kitchen Health Maintenance issues including appropriate healthy diet, exercise, and smoking avoidance were discussed with patient. . See orders for this visit as documented in the electronic medical record.  Arnette Norris, MD 12/15/2018

## 2018-12-15 NOTE — Assessment & Plan Note (Addendum)
>  25 minutes spent in face to face time with patient, >50% spent in counselling or coordination of care discussing anxiety and asthma. Anxiety is under good control.  No longer using celexa but and rarely using ativan. GAD 7 : Generalized Anxiety Score 12/15/2018  Nervous, Anxious, on Edge 1  Control/stop worrying 0  Worry too much - different things 3  Trouble relaxing 0  Restless 0  Easily annoyed or irritable 0  Afraid - awful might happen 0  Total GAD 7 Score 4  Anxiety Difficulty Not difficult at all

## 2019-02-11 ENCOUNTER — Ambulatory Visit (INDEPENDENT_AMBULATORY_CARE_PROVIDER_SITE_OTHER): Payer: 59 | Admitting: Family Medicine

## 2019-02-11 ENCOUNTER — Encounter: Payer: Self-pay | Admitting: Family Medicine

## 2019-02-11 VITALS — Temp 99.0°F

## 2019-02-11 DIAGNOSIS — J019 Acute sinusitis, unspecified: Secondary | ICD-10-CM

## 2019-02-11 MED ORDER — CEFDINIR 300 MG PO CAPS: 300.0000 mg | ORAL_CAPSULE | Freq: Two times a day (BID) | ORAL | 0 refills | Status: AC

## 2019-02-11 MED ORDER — PREDNISONE 20 MG PO TABS: ORAL_TABLET | ORAL | 0 refills | Status: DC

## 2019-02-11 NOTE — Progress Notes (Signed)
Virtual Visit via Video Note  I connected with Mackenzie Shea on 02/11/19 at 11:00 AM EDT by a video enabled telemedicine application and verified that I am speaking with the correct person using two identifiers. Location patient: home Location provider: work  Persons participating in the virtual visit: patient, provider  I discussed the limitations of evaluation and management by telemedicine and the availability of in person appointments. The patient expressed understanding and agreed to proceed.  Chief Complaint  Patient presents with  . Nasal Congestion    nasal congestion, post nasal drip, sinus pressure, pain in face and upper teeth/2 weeks/ musinex sinus, zyrtec and flonase     HPI: Mackenzie Shea is a 38 y.o. female who complains of 2 week h/o of worsening facial pressure, headache, PND, pain in upper teeth, breath smells bad, throat clearing. Pt reports general malaise, low grade temp 99. No runny nose. No sore throat. Some nasal congestion. No ear pain/fullness and hearing is ok.  Pt has been taking singulair daily and now also flonase, zyrtec daily as well as mucinex and ibuprofen PRN.   Past Medical History:  Diagnosis Date  . Allergic rhinitis, cause unspecified   . Allergy   . Anemia    history of  . Anxiety   . Elevated blood pressure reading without diagnosis of hypertension   . Esophageal reflux   . Extrinsic asthma, unspecified   . Leukocytosis, unspecified   . Pain in joint, shoulder region   . Personal history of traumatic fracture   . Tachycardia    with anxiety  . Unspecified vitamin D deficiency     Past Surgical History:  Procedure Laterality Date  . ABDOMINAL HYSTERECTOMY  05/01/2017   Dr. Hazle Coca @ [facility] (Complete)  . HYSTERECTOMY ABDOMINAL WITH SALPINGECTOMY Bilateral 04/30/2017   Procedure: HYSTERECTOMY ABDOMINAL WITH SALPINGECTOMY;  Surgeon: Dian Queen, MD;  Location: [facility];  Service: Gynecology;  Laterality: Bilateral;     Family History  Problem Relation Age of Onset  . Heart disease Father     Social History   Tobacco Use  . Smoking status: Never Smoker  . Smokeless tobacco: Never Used  Substance Use Topics  . Alcohol use: No  . Drug use: No     Current Outpatient Medications:  .  cetirizine (ZYRTEC) 10 MG tablet, Take 10 mg by mouth at bedtime as needed for allergies. , Disp: , Rfl:  .  fluticasone (VERAMYST) 27.5 MCG/SPRAY nasal spray, Place 2 sprays into the nose daily., Disp: , Rfl:  .  levalbuterol (XOPENEX HFA) 45 MCG/ACT inhaler, INHALE ONE TO TWO PUFFS INTO LUNGS EVERY 4 HOURS AS NEEDED, Disp: 15 g, Rfl: 2 .  levalbuterol (XOPENEX) 0.63 MG/3ML nebulizer solution, Take 3 mLs (0.63 mg total) by nebulization every 4 (four) hours as needed for wheezing or shortness of breath., Disp: 3 mL, Rfl: 2 .  LORazepam (ATIVAN) 0.5 MG tablet, Take 1 tablet (0.5 mg total) by mouth 2 (two) times daily., Disp: 30 tablet, Rfl: 2 .  montelukast (SINGULAIR) 10 MG tablet, Take 1 tablet (10 mg total) by mouth at bedtime., Disp: 90 tablet, Rfl: 2 .  Multiple Vitamin (MULTIVITAMIN WITH MINERALS) TABS tablet, Take 1 tablet by mouth daily., Disp: , Rfl:  .  SUMAtriptan (IMITREX) 25 MG tablet, Take 2 tablets (50 mg total) by mouth every 2 (two) hours as needed for migraine., Disp: 10 tablet, Rfl: 2 .  cefdinir (OMNICEF) 300 MG capsule, Take 1 capsule (300 mg total)  by mouth 2 (two) times daily for 10 days., Disp: 20 capsule, Rfl: 0 .  predniSONE (DELTASONE) 20 MG tablet, 3 tabs po x 2 days, 2 tabs po x 2 days, 1 tab po x 2 days, 1/2 tab po x 2 days, Disp: 13 tablet, Rfl: 0  Allergies  Allergen Reactions  . Penicillins Hives    Has patient had a PCN reaction causing immediate rash, facial/tongue/throat swelling, SOB or lightheadedness with hypotension: No Has patient had a PCN reaction causing severe rash involving mucus membranes or skin necrosis: No Has patient had a PCN reaction that required hospitalization:  No Has patient had a PCN reaction occurring within the last 10 years: No If all of the above answers are "NO", then may proceed with Cephalosporin use.   . Pseudoephedrine Hcl Palpitations      ROS: See pertinent positives and negatives per HPI.   EXAM:  VITALS per patient if applicable: Temp 99 F (89.2 C) (Oral) Comment: pt reported  LMP 04/30/2017 (Exact Date)    GENERAL: alert, oriented, and in no acute distress  HEENT: atraumatic, conjunctiva clear, no obvious abnormalities on inspection of external nose and ears; pt rubs face and forehead often  NECK: normal movements of the head and neck  LUNGS: on inspection no signs of respiratory distress, breathing rate appears normal, no obvious gross SOB, gasping or wheezing, no conversational dyspnea  CV: no obvious cyanosis  PSYCH/NEURO: pleasant and cooperative, speech and thought processing grossly intact   ASSESSMENT AND PLAN:  1. Acute non-recurrent sinusitis, unspecified location - cont with supportive care, daily flonase and zyrtec and singulair  Rx: - cefdinir (OMNICEF) 300 MG capsule; Take 1 capsule (300 mg total) by mouth 2 (two) times daily for 10 days.  Dispense: 20 capsule; Refill: 0 - predniSONE (DELTASONE) 20 MG tablet; 3 tabs po x 2 days, 2 tabs po x 2 days, 1 tab po x 2 days, 1/2 tab po x 2 days  Dispense: 13 tablet; Refill: 0 - f/u if symptoms worsen or do not improve in 7-10 days Discussed plan and reviewed medications with patient, including risks, benefits, and potential side effects. Pt expressed understand. All questions answered.       I discussed the assessment and treatment plan with the patient. The patient was provided an opportunity to ask questions and all were answered. The patient agreed with the plan and demonstrated an understanding of the instructions.   The patient was advised to call back or seek an in-person evaluation if the symptoms worsen or if the condition fails to improve as  anticipated.   Mackenzie Median, DO

## 2019-03-22 ENCOUNTER — Encounter: Payer: Self-pay | Admitting: Family Medicine

## 2019-03-22 ENCOUNTER — Telehealth: Payer: Self-pay

## 2019-03-22 ENCOUNTER — Ambulatory Visit (INDEPENDENT_AMBULATORY_CARE_PROVIDER_SITE_OTHER): Payer: 59 | Admitting: Family Medicine

## 2019-03-22 ENCOUNTER — Other Ambulatory Visit: Payer: 59

## 2019-03-22 DIAGNOSIS — R198 Other specified symptoms and signs involving the digestive system and abdomen: Secondary | ICD-10-CM | POA: Insufficient documentation

## 2019-03-22 DIAGNOSIS — R1011 Right upper quadrant pain: Secondary | ICD-10-CM | POA: Diagnosis not present

## 2019-03-22 LAB — COMPREHENSIVE METABOLIC PANEL
ALT: 23 U/L (ref 0–35)
AST: 23 U/L (ref 0–37)
Albumin: 4 g/dL (ref 3.5–5.2)
Alkaline Phosphatase: 55 U/L (ref 39–117)
BUN: 6 mg/dL (ref 6–23)
CO2: 26 mEq/L (ref 19–32)
Calcium: 8.9 mg/dL (ref 8.4–10.5)
Chloride: 103 mEq/L (ref 96–112)
Creatinine, Ser: 1.04 mg/dL (ref 0.40–1.20)
GFR: 71.29 mL/min (ref >60.00)
Glucose, Bld: 91 mg/dL (ref 70–99)
Potassium: 3.7 mEq/L (ref 3.5–5.1)
Sodium: 137 mEq/L (ref 135–145)
Total Bilirubin: 0.4 mg/dL (ref 0.2–1.2)
Total Protein: 6.9 g/dL (ref 6.0–8.3)

## 2019-03-22 LAB — CBC WITH DIFFERENTIAL/PLATELET
Basophils Absolute: 0 10*3/uL (ref 0.0–0.1)
Basophils Relative: 0.4 % (ref 0.0–3.0)
Eosinophils Absolute: 0.2 10*3/uL (ref 0.0–0.7)
Eosinophils Relative: 1.8 % (ref 0.0–5.0)
HCT: 45 % (ref 36.0–46.0)
Hemoglobin: 14.9 g/dL (ref 12.0–15.0)
Lymphocytes Relative: 31.3 % (ref 12.0–46.0)
Lymphs Abs: 3 10*3/uL (ref 0.7–4.0)
MCHC: 33.2 g/dL (ref 30.0–36.0)
MCV: 79.7 fl (ref 78.0–100.0)
Monocytes Absolute: 0.6 10*3/uL (ref 0.1–1.0)
Monocytes Relative: 6 % (ref 3.0–12.0)
Neutro Abs: 5.9 10*3/uL (ref 1.4–7.7)
Neutrophils Relative %: 60.5 % (ref 43.0–77.0)
Platelets: 316 10*3/uL (ref 150.0–400.0)
RBC: 5.64 Mil/uL — ABNORMAL HIGH (ref 3.87–5.11)
RDW: 13.1 % (ref 11.5–15.5)
WBC: 9.7 10*3/uL (ref 4.0–10.5)

## 2019-03-22 LAB — LIPASE: Lipase: 19 U/L (ref 11.0–59.0)

## 2019-03-22 NOTE — Assessment & Plan Note (Signed)
>  25 minutes spent in face to face time with patient, >50% spent in counselling or coordination of care- symptoms concerning for biliary colic vs IBS.  WIll check stat labs at my office and order a stat RUQ [place].  In the mean time, advised to please keep a symptoms journal, ie when did symptoms start- what had you just eaten, how long did the last, what made it better or worse.  Try minimizing stress and foods that worsen symptoms.   Try over the counter gas-x (four times a day when necessary) or beano for bloating.  Peppermint oil can sometimes help with symptoms as well.  Common culprits are hard-to-digest carbs called "FODMAPs"...fermentable, oligo-, di-, and mono-saccharides and polyols...in some fruits, veggies, milk, grains, etc.  Exclude gas producing foods (beans, onions, celery, carrots, raisins, bananas, apricots, prunes, brussel sprouts, wheat germ, pretzels)   Consider trail of lactose free diet (back off milk)   Trial of Align, probiotic, for bloating/IBS symptoms (OTC).  The patient indicates understanding of these issues and agrees with the plan.  Orders Placed This Encounter  Procedures  . US Abdomen Limited RUQ  . H. pylori breath test  . Comprehensive metabolic panel  . Lipase  . CBC with Differential/Platelet

## 2019-03-22 NOTE — Telephone Encounter (Signed)
Questions for Screening COVID-19  Symptom onset: None  Travel or Contacts: None  During this illness, did/does the patient experience any of the following symptoms? Fever >100.64F []   Yes [x]   No []   Unknown Subjective fever (felt feverish) []   Yes [x]   No []   Unknown Chills []   Yes [x]   No []   Unknown Muscle aches (myalgia) []   Yes [x]   No []   Unknown Runny nose (rhinorrhea) []   Yes [x]   No []   Unknown Sore throat []   Yes [x]   No []   Unknown Cough (new onset or worsening of chronic cough) []   Yes [x]   No []   Unknown Shortness of breath (dyspnea) []   Yes [x]   No []   Unknown Nausea or vomiting []   Yes [x]   No []   Unknown Headache []   Yes [x]   No []   Unknown Abdominal pain  []   Yes [x]   No []   Unknown Diarrhea (?3 loose/looser than normal stools/24hr period) []   Yes [x]   No []   Unknown Other, specify:  Patient risk factors: Smoker? []   Current []   Former []   Never If female, currently pregnant? []   Yes []   No  Patient Active Problem List   Diagnosis Date Noted  . GI symptoms 03/22/2019  . Cellulitis of head except face 04/30/2018  . Paroxysmal tachycardia ([place]) 09/12/2017  . S/P TAH (total abdominal hysterectomy) 04/30/2017  . Palpitations 12/10/2016  . Essential hypertension 12/10/2016  . Migraine headache 08/07/2016  . Anxiety state 01/21/2016  . Anemia, iron deficiency 04/06/2014  . Asthma, chronic 12/28/2012  . Vitamin Neria Procter deficiency 10/02/2011    Plan:  []   High risk for COVID-19 with red flags go to ED (with CP, SOB, weak/lightheaded, or fever > 101.5). Call ahead.  []   High risk for COVID-19 but stable. Inform provider and coordinate time for [facility] visit.   []   No red flags but URI signs or symptoms okay for [facility] visit.

## 2019-03-22 NOTE — Progress Notes (Signed)
Virtual Visit via Video   Due to the COVID-19 pandemic, this visit was completed with telemedicine (audio/video) technology to reduce patient and provider exposure as well as to preserve personal protective equipment.   I connected with Mackenzie Shea by a video enabled telemedicine application and verified that I am speaking with the correct person using two identifiers. Location patient: Home Location provider: [facility], Office Persons participating in the virtual visit: Seabron Spates, MD   I discussed the limitations of evaluation and management by telemedicine and the availability of in person appointments. The patient expressed understanding and agreed to proceed.  Care Team   Patient Care Team: Lucille Passy, MD as PCP - General (Family Medicine) Dian Queen, MD as Consulting Physician (Obstetrics and Gynecology) Minna Merritts, MD as Consulting Physician (Cardiology)  Subjective:   HPI:  Abdominal pain-  Complains of abdominal pain and bloating x 3 weeks.  Years ago and, had symptoms like this when she stops eating beef, it went away.    Past 3 weeks had bloating after completely prednisone and abx.  RUQ and LUQ pain start 4 or 5 days ago.  Yesterday.  Pain is worse postraprandial.  Started waking her up from sleep 3 days ago.  Started back taking Protonix 40 mg twice daily 3 days ago due to the acid taste in her throat.  Not sure if she ate sometimes.   Describes symptoms as dull to severe ache.    + nausea, no vomiting, no diarrhea or blood in stool.  Today nausea is better.  No fever, body aches.  She is not sure what sure what foods make it worse.  Does have a h/o GERD and IBS.  She has not had any constipation this time.   Remote h/o TAH BSO (04/2017)- Dr. Helane Rima due to fibroids.  Review of Systems  Constitutional: Negative for chills, diaphoresis, fever, malaise/fatigue and weight loss.  HENT: Negative.   Eyes: Negative.    Respiratory: Negative.   Cardiovascular: Negative.   Gastrointestinal: Positive for abdominal pain, heartburn and nausea. Negative for blood in stool, constipation, diarrhea, melena and vomiting.  Genitourinary: Negative.   Musculoskeletal: Negative.   Skin: Negative.   Neurological: Negative.   All other systems reviewed and are negative.    Patient Active Problem List   Diagnosis Date Noted  . GI symptoms 03/22/2019  . Cellulitis of head except face 04/30/2018  . Paroxysmal tachycardia ([place]) 09/12/2017  . S/P TAH (total abdominal hysterectomy) 04/30/2017  . Palpitations 12/10/2016  . Essential hypertension 12/10/2016  . Migraine headache 08/07/2016  . Anxiety state 01/21/2016  . Anemia, iron deficiency 04/06/2014  . Asthma, chronic 12/28/2012  . Vitamin D deficiency 10/02/2011    Social History   Tobacco Use  . Smoking status: Never Smoker  . Smokeless tobacco: Never Used  Substance Use Topics  . Alcohol use: No    Current Outpatient Medications:  .  cetirizine (ZYRTEC) 10 MG tablet, Take 10 mg by mouth at bedtime as needed for allergies. , Disp: , Rfl:  .  fluticasone (VERAMYST) 27.5 MCG/SPRAY nasal spray, Place 2 sprays into the nose daily., Disp: , Rfl:  .  levalbuterol (XOPENEX HFA) 45 MCG/ACT inhaler, INHALE ONE TO TWO PUFFS INTO LUNGS EVERY 4 HOURS AS NEEDED, Disp: 15 g, Rfl: 2 .  levalbuterol (XOPENEX) 0.63 MG/3ML nebulizer solution, Take 3 mLs (0.63 mg total) by nebulization every 4 (four) hours as needed for wheezing or shortness of breath.,  Disp: 3 mL, Rfl: 2 .  LORazepam (ATIVAN) 0.5 MG tablet, Take 1 tablet (0.5 mg total) by mouth 2 (two) times daily., Disp: 30 tablet, Rfl: 2 .  montelukast (SINGULAIR) 10 MG tablet, Take 1 tablet (10 mg total) by mouth at bedtime., Disp: 90 tablet, Rfl: 2 .  Multiple Vitamin (MULTIVITAMIN WITH MINERALS) TABS tablet, Take 1 tablet by mouth daily., Disp: , Rfl:  .  predniSONE (DELTASONE) 20 MG tablet, 3 tabs po x 2 days, 2 tabs  po x 2 days, 1 tab po x 2 days, 1/2 tab po x 2 days, Disp: 13 tablet, Rfl: 0 .  SUMAtriptan (IMITREX) 25 MG tablet, Take 2 tablets (50 mg total) by mouth every 2 (two) hours as needed for migraine., Disp: 10 tablet, Rfl: 2  Allergies  Allergen Reactions  . Penicillins Hives    Has patient had a PCN reaction causing immediate rash, facial/tongue/throat swelling, SOB or lightheadedness with hypotension: No Has patient had a PCN reaction causing severe rash involving mucus membranes or skin necrosis: No Has patient had a PCN reaction that required hospitalization: No Has patient had a PCN reaction occurring within the last 10 years: No If all of the above answers are "NO", then may proceed with Cephalosporin use.   . Pseudoephedrine Hcl Palpitations    Objective:   VITALS: Per patient if applicable, see vitals. GENERAL: Alert, appears well and in no acute distress. HEENT: Atraumatic, conjunctiva clear, no obvious abnormalities on inspection of external nose and ears. NECK: Normal movements of the head and neck. CARDIOPULMONARY: No increased WOB. Speaking in clear sentences. I:E ratio WNL.  MS: Moves all visible extremities without noticeable abnormality. PSYCH: Pleasant and cooperative, well-groomed. Speech normal rate and rhythm. Affect is appropriate. Insight and judgement are appropriate. Attention is focused, linear, and appropriate.  Abd: She is TTP over RUQ, no rebound that she can appreciate NEURO: CN grossly intact. Oriented as arrived to appointment on time with no prompting. Moves both UE equally.  SKIN: No obvious lesions, wounds, erythema, or cyanosis noted on face or hands.  Depression screen [facility] 2/9 12/15/2018 09/07/2017 12/22/2015  Decreased Interest 0 0 0  Down, Depressed, Hopeless 0 0 0  PHQ - 2 Score 0 0 0    Assessment and Plan:   Mackenzie Shea was seen today for abdominal pain.  Diagnoses and all orders for this visit:  GI symptoms    . COVID-19 Education: The signs  and symptoms of COVID-19 were discussed with the patient and how to seek care for testing if needed. The importance of social distancing was discussed today. . Reviewed expectations re: course of current medical issues. . Discussed self-management of symptoms. . Outlined signs and symptoms indicating need for more acute intervention. . Patient verbalized understanding and all questions were answered. Marland Kitchen Health Maintenance issues including appropriate healthy diet, exercise, and smoking avoidance were discussed with patient. . See orders for this visit as documented in the electronic medical record.  Arnette Norris, MD  Records requested if needed. Time spent: 25 minutes, of which >50% was spent in obtaining information about her symptoms, reviewing her previous labs, evaluations, and treatments, counseling her about her condition (please see the discussed topics above), and developing a plan to further investigate it; she had a number of questions which I addressed.

## 2019-03-23 ENCOUNTER — Encounter: Payer: Self-pay | Admitting: Family Medicine

## 2019-03-23 ENCOUNTER — Other Ambulatory Visit: Payer: Self-pay

## 2019-03-23 ENCOUNTER — Ambulatory Visit
Admission: RE | Admit: 2019-03-23 | Discharge: 2019-03-23 | Disposition: A | Discharge status: Home / Self Care | Payer: 59 | Source: Ambulatory Visit | Attending: Family Medicine | Admitting: Family Medicine

## 2019-03-23 DIAGNOSIS — R198 Other specified symptoms and signs involving the digestive system and abdomen: Secondary | ICD-10-CM | POA: Insufficient documentation

## 2019-03-23 DIAGNOSIS — R1011 Right upper quadrant pain: Secondary | ICD-10-CM | POA: Insufficient documentation

## 2019-03-23 LAB — H. PYLORI BREATH TEST: H. pylori Breath Test: NOT DETECTED

## 2019-07-04 ENCOUNTER — Other Ambulatory Visit: Payer: Self-pay | Admitting: Family Medicine

## 2019-07-04 ENCOUNTER — Encounter: Payer: Self-pay | Admitting: Family Medicine

## 2019-07-04 NOTE — Telephone Encounter (Signed)
LAST Fill 12/15/18  #30/2 Last OV 03/22/19

## 2019-07-06 DIAGNOSIS — F411 Generalized anxiety disorder: Secondary | ICD-10-CM | POA: Insufficient documentation

## 2019-07-06 NOTE — Progress Notes (Signed)
Virtual Visit via Video   Due to the COVID-19 pandemic, this visit was completed with telemedicine (audio/video) technology to reduce patient and provider exposure as well as to preserve personal protective equipment.   I connected with [name] by a video enabled telemedicine application and verified that I am speaking with the correct person using two identifiers. Location patient: Home Location provider: [facility], Office Persons participating in the virtual visit: Seabron Spates, MD   I discussed the limitations of evaluation and management by telemedicine and the availability of in person appointments. The patient expressed understanding and agreed to proceed.  Care Team   Patient Care Team: Lucille Passy, MD as PCP - General (Family Medicine) Dian Queen, MD as Consulting Physician (Obstetrics and Gynecology) Minna Merritts, MD as Consulting Physician (Cardiology)  Subjective:   HPI:   Patient sent the following message on 07/04/19:  "Good morning. I have scheduled a virtual appt with you but the soonest I could see you is Thursday. I have been under a lot of stress and my anxiety along with the random panic attacks have come back. I was on Lorazepam and Celexa before for the same issues and have some left over from before so I stated taking them. I started Ativan on last Thursday and the Celexa on Fri. I've had to take the Ativan 2-3 times a day to control the attacks so my supply is getting very low. Ive only taken 3 days of the 20mg  Celexa so I'm ok with those. I don't think they're in my system yet so I'm still taking the .5 Ativan at least twice a day. There were some instances where I had to take 1mg  of Ativan because I couldn't stop shaking. I'm sure this is stress. My body responds to stress this way every time. The only scary part is that I have lost my appetite for about 3 days now and my mouth gets very dry and I am only drink protein  drinks and smoothies because my nerves tense up my swallowing muscles. I want to talk with you virtually to make sure I'm on the right regimen and let you know these things are back again. As well as refills if I will continue with these meds. Thanks and I look forward to hearing from you."  Lorazepam bid prn per [place] PMP this has not been filled since 5.17.2019.  She is already feeling the citalopram is helping.  Denies feeling depressed.  Mother is great support. Depression screen [facility] 2/9 07/07/2019 12/15/2018 09/07/2017 12/22/2015 08/06/2015  Decreased Interest 0 0 0 0 0  Down, Depressed, Hopeless 0 0 0 0 0  PHQ - 2 Score 0 0 0 0 0  Altered sleeping 0 - - - -  Tired, decreased energy 1 - - - -  Change in appetite 2 - - - -  Feeling bad or failure about yourself  0 - - - -  Trouble concentrating 1 - - - -  Moving slowly or fidgety/restless 1 - - - -  Suicidal thoughts 0 - - - -  PHQ-9 Score 5 - - - -  Difficult doing work/chores Somewhat difficult - - - -   GAD 7 : Generalized Anxiety Score 07/07/2019 12/15/2018  Nervous, Anxious, on Edge 3 1  Control/stop worrying 2 0  Worry too much - different things 2 3  Trouble relaxing 2 0  Restless 0 0  Easily annoyed or irritable 1  0  Afraid - awful might happen 2 0  Total GAD 7 Score 12 4  Anxiety Difficulty Very difficult Not difficult at all       Review of Systems  Constitutional: Positive for malaise/fatigue.  HENT: Negative.   Eyes: Negative.   Respiratory: Negative.   Gastrointestinal: Negative.   Genitourinary: Negative.   Musculoskeletal: Negative.   Skin: Negative.   Psychiatric/Behavioral: Negative for depression, hallucinations, memory loss, substance abuse and suicidal ideas. The patient is nervous/anxious. The patient does not have insomnia.   All other systems reviewed and are negative.    Patient Active Problem List   Diagnosis Date Noted   GAD (generalized anxiety disorder) 07/06/2019   GI symptoms 03/22/2019    Cellulitis of head except face 04/30/2018   Paroxysmal tachycardia (HCC) 09/12/2017   S/P TAH (total abdominal hysterectomy) 04/30/2017   Palpitations 12/10/2016   Essential hypertension 12/10/2016   Migraine headache 08/07/2016   Anxiety state 01/21/2016   Anemia, iron deficiency 04/06/2014   Asthma, chronic 12/28/2012   Vitamin D deficiency 10/02/2011    Social History   Tobacco Use   Smoking status: Never Smoker   Smokeless tobacco: Never Used  Substance Use Topics   Alcohol use: No    Current Outpatient Medications:    cetirizine (ZYRTEC) 10 MG tablet, Take 10 mg by mouth at bedtime as needed for allergies. , Disp: , Rfl:    citalopram (CELEXA) 20 MG tablet, Take 1 tablet (20 mg total) by mouth daily., Disp: 30 tablet, Rfl: 5   fluticasone (VERAMYST) 27.5 MCG/SPRAY nasal spray, Place 2 sprays into the nose daily., Disp: , Rfl:    levalbuterol (XOPENEX HFA) 45 MCG/ACT inhaler, INHALE ONE TO TWO PUFFS INTO LUNGS EVERY 4 HOURS AS NEEDED, Disp: 15 g, Rfl: 2   levalbuterol (XOPENEX) 0.63 MG/3ML nebulizer solution, Take 3 mLs (0.63 mg total) by nebulization every 4 (four) hours as needed for wheezing or shortness of breath., Disp: 3 mL, Rfl: 2   LORazepam (ATIVAN) 0.5 MG tablet, Take 1 tablet (0.5 mg total) by mouth 2 (two) times daily., Disp: 60 tablet, Rfl: 5   montelukast (SINGULAIR) 10 MG tablet, Take 1 tablet (10 mg total) by mouth at bedtime., Disp: 90 tablet, Rfl: 2   Multiple Vitamin (MULTIVITAMIN WITH MINERALS) TABS tablet, Take 1 tablet by mouth daily., Disp: , Rfl:    SUMAtriptan (IMITREX) 25 MG tablet, Take 2 tablets (50 mg total) by mouth every 2 (two) hours as needed for migraine., Disp: 10 tablet, Rfl: 2  Allergies  Allergen Reactions   Penicillins Hives    Has patient had a PCN reaction causing immediate rash, facial/tongue/throat swelling, SOB or lightheadedness with hypotension: No Has patient had a PCN reaction causing severe rash involving  mucus membranes or skin necrosis: No Has patient had a PCN reaction that required hospitalization: No Has patient had a PCN reaction occurring within the last 10 years: No If all of the above answers are "NO", then may proceed with Cephalosporin use.    Pseudoephedrine Hcl Palpitations    BP 127/88    LMP 04/30/2017 (Exact Date)     VITALS: Per patient if applicable, see vitals. GENERAL: Alert, appears well and in no acute distress. HEENT: Atraumatic, conjunctiva clear, no obvious abnormalities on inspection of external nose and ears. NECK: Normal movements of the head and neck. CARDIOPULMONARY: No increased WOB. Speaking in clear sentences. I:E ratio WNL.  MS: Moves all visible extremities without noticeable abnormality. PSYCH: Pleasant and cooperative,  well-groomed. Speech normal rate and rhythm. Affect is appropriate. Insight and judgement are appropriate. Attention is focused, linear, and appropriate.  NEURO: CN grossly intact. Oriented as arrived to appointment on time with no prompting. Moves both UE equally.  SKIN: No obvious lesions, wounds, erythema, or cyanosis noted on face or hands.  Depression screen [facility] 2/9 07/07/2019 12/15/2018 09/07/2017  Decreased Interest 0 0 0  Down, Depressed, Hopeless 0 0 0  PHQ - 2 Score 0 0 0  Altered sleeping 0 - -  Tired, decreased energy 1 - -  Change in appetite 2 - -  Feeling bad or failure about yourself  0 - -  Trouble concentrating 1 - -  Moving slowly or fidgety/restless 1 - -  Suicidal thoughts 0 - -  PHQ-9 Score 5 - -  Difficult doing work/chores Somewhat difficult - -    Assessment and Plan:   Santa was seen today for anxiety.  Diagnoses and all orders for this visit:  GAD (generalized anxiety disorder)  Other orders -     citalopram (CELEXA) 20 MG tablet; Take 1 tablet (20 mg total) by mouth daily. -     LORazepam (ATIVAN) 0.5 MG tablet; Take 1 tablet (0.5 mg total) by mouth 2 (two) times daily.     COVID-19  Education: The signs and symptoms of COVID-19 were discussed with the patient and how to seek care for testing if needed. The importance of social distancing was discussed today.  Reviewed expectations re: course of current medical issues.  Discussed self-management of symptoms.  Outlined signs and symptoms indicating need for more acute intervention.  Patient verbalized understanding and all questions were answered.  Health Maintenance issues including appropriate healthy diet, exercise, and smoking avoidance were discussed with patient.  See orders for this visit as documented in the electronic medical record.  Arnette Norris, MD  Records requested if needed. Time spent: 25 minutes, of which >50% was spent in obtaining information about her symptoms, reviewing her previous labs, evaluations, and treatments, counseling her about her condition (please see the discussed topics above), and developing a plan to further investigate it; she had a number of questions which I addressed.

## 2019-07-07 ENCOUNTER — Ambulatory Visit (INDEPENDENT_AMBULATORY_CARE_PROVIDER_SITE_OTHER): Payer: 59 | Admitting: Family Medicine

## 2019-07-07 ENCOUNTER — Encounter: Payer: Self-pay | Admitting: Family Medicine

## 2019-07-07 DIAGNOSIS — I479 Paroxysmal tachycardia, unspecified: Secondary | ICD-10-CM | POA: Diagnosis not present

## 2019-07-07 DIAGNOSIS — F411 Generalized anxiety disorder: Secondary | ICD-10-CM | POA: Diagnosis not present

## 2019-07-07 MED ORDER — CITALOPRAM HYDROBROMIDE 20 MG PO TABS: 20.0000 mg | ORAL_TABLET | Freq: Every day | ORAL | 5 refills | Status: DC

## 2019-07-07 MED ORDER — LORAZEPAM 0.5 MG PO TABS: 0.5000 mg | ORAL_TABLET | Freq: Two times a day (BID) | ORAL | 5 refills | Status: DC

## 2019-07-07 NOTE — Assessment & Plan Note (Signed)
>  25 minutes spent in face to face time with patient, >50% spent in counselling or coordination of care discussing her worsening anxiety and panic attacks.  She is not sure why it is worse but she does have a lot stressors- she has already restarted citalopram and hopes to only use lorazepam as needed like last time until the celexa is fully in her system.  Call or send my chart message prn if these symptoms worsen or fail to improve as anticipated. The patient indicates understanding of these issues and agrees with the plan.

## 2019-08-22 ENCOUNTER — Encounter: Payer: Self-pay | Admitting: Family Medicine

## 2019-08-22 ENCOUNTER — Other Ambulatory Visit: Payer: Self-pay | Admitting: Family Medicine

## 2019-08-22 MED ORDER — CITALOPRAM HYDROBROMIDE 40 MG PO TABS: 40.0000 mg | ORAL_TABLET | Freq: Every day | ORAL | 1 refills | Status: DC

## 2019-09-26 NOTE — Progress Notes (Signed)
Virtual Visit via Video   Due to the COVID-19 pandemic, this visit was completed with telemedicine (audio/video) technology to reduce patient and provider exposure as well as to preserve personal protective equipment.   I connected with [name] by a video enabled telemedicine application and verified that I am speaking with the correct person using two identifiers. Location patient: Home Location provider: [facility], Office Persons participating in the virtual visit: Seabron Spates, MD   I discussed the limitations of evaluation and management by telemedicine and the availability of in person appointments. The patient expressed understanding and agreed to proceed.  Care Team   Patient Care Team: Lucille Passy, MD as PCP - General (Family Medicine) Dian Queen, MD as Consulting Physician (Obstetrics and Gynecology) Minna Merritts, MD as Consulting Physician (Cardiology)  Subjective:   HPI: Patient is connecting to discuss Sx of wheezing. Started 1-week-ago.  Review of Systems  Constitutional: Negative for fever and malaise/fatigue.  HENT: Positive for congestion. Negative for hearing loss.   Eyes: Negative for blurred vision, discharge and redness.  Respiratory: Positive for cough, shortness of breath and wheezing. Negative for sputum production.   Cardiovascular: Negative for chest pain, palpitations and leg swelling.  Gastrointestinal: Negative for abdominal pain and heartburn.  Genitourinary: Negative for dysuria.  Musculoskeletal: Negative for falls and myalgias.  Skin: Negative for rash.  Neurological: Negative for loss of consciousness and headaches.  Endo/Heme/Allergies: Does not bruise/bleed easily.  Psychiatric/Behavioral: Negative for depression and memory loss.     Patient Active Problem List   Diagnosis Date Noted  . Upper respiratory infection 09/29/2019  . GAD (generalized anxiety disorder) 07/06/2019  . GI symptoms 03/22/2019   . Cellulitis of head except face 04/30/2018  . Paroxysmal tachycardia ([place]) 09/12/2017  . S/P TAH (total abdominal hysterectomy) 04/30/2017  . Palpitations 12/10/2016  . Essential hypertension 12/10/2016  . Migraine headache 08/07/2016  . Anxiety state 01/21/2016  . Anemia, iron deficiency 04/06/2014  . Asthma, chronic 12/28/2012  . Vitamin D deficiency 10/02/2011    Social History   Tobacco Use  . Smoking status: Never Smoker  . Smokeless tobacco: Never Used  Substance Use Topics  . Alcohol use: No    Current Outpatient Medications:  .  cetirizine (ZYRTEC) 10 MG tablet, Take 10 mg by mouth at bedtime as needed for allergies. , Disp: , Rfl:  .  citalopram (CELEXA) 40 MG tablet, Take 1 tablet (40 mg total) by mouth daily., Disp: 90 tablet, Rfl: 1 .  fluticasone (VERAMYST) 27.5 MCG/SPRAY nasal spray, Place 2 sprays into the nose daily., Disp: , Rfl:  .  levalbuterol (XOPENEX HFA) 45 MCG/ACT inhaler, INHALE ONE TO TWO PUFFS INTO LUNGS EVERY 4 HOURS AS NEEDED, Disp: 15 g, Rfl: 2 .  levalbuterol (XOPENEX) 0.63 MG/3ML nebulizer solution, Take 3 mLs (0.63 mg total) by nebulization every 4 (four) hours as needed for wheezing or shortness of breath., Disp: 3 mL, Rfl: 2 .  LORazepam (ATIVAN) 0.5 MG tablet, Take 1 tablet (0.5 mg total) by mouth 2 (two) times daily., Disp: 60 tablet, Rfl: 5 .  montelukast (SINGULAIR) 10 MG tablet, Take 1 tablet (10 mg total) by mouth at bedtime., Disp: 90 tablet, Rfl: 2 .  Multiple Vitamin (MULTIVITAMIN WITH MINERALS) TABS tablet, Take 1 tablet by mouth daily., Disp: , Rfl:  .  SUMAtriptan (IMITREX) 25 MG tablet, Take 2 tablets (50 mg total) by mouth every 2 (two) hours as needed for migraine., Disp: 10 tablet,  Rfl: 2 .  predniSONE (DELTASONE) 10 MG tablet, 3 tabs by mouth x 3 days, 2 tabs by mouth x 2 days, 1 tab by mouth x 2 days and stop., Disp: 15 tablet, Rfl: 0  Allergies  Allergen Reactions  . Penicillins Hives    Has patient had a PCN reaction causing  immediate rash, facial/tongue/throat swelling, SOB or lightheadedness with hypotension: No Has patient had a PCN reaction causing severe rash involving mucus membranes or skin necrosis: No Has patient had a PCN reaction that required hospitalization: No Has patient had a PCN reaction occurring within the last 10 years: No If all of the above answers are "NO", then may proceed with Cephalosporin use.   . Pseudoephedrine Hcl Palpitations    Objective:  BP 115/80   LMP 04/30/2017 (Exact Date)   VITALS: Per patient if applicable, see vitals. GENERAL: Alert, appears well and in no acute distress. HEENT: Atraumatic, conjunctiva clear, no obvious abnormalities on inspection of external nose and ears. NECK: Normal movements of the head and neck. CARDIOPULMONARY: No increased WOB. Speaking in clear sentences. I:E ratio WNL.  MS: Moves all visible extremities without noticeable abnormality. PSYCH: Pleasant and cooperative, well-groomed. Speech normal rate and rhythm. Affect is appropriate. Insight and judgement are appropriate. Attention is focused, linear, and appropriate.  NEURO: CN grossly intact. Oriented as arrived to appointment on time with no prompting. Moves both UE equally.  SKIN: No obvious lesions, wounds, erythema, or cyanosis noted on face or hands.  Depression screen [facility] 2/9 07/07/2019 12/15/2018 09/07/2017  Decreased Interest 0 0 0  Down, Depressed, Hopeless 0 0 0  PHQ - 2 Score 0 0 0  Altered sleeping 0 - -  Tired, decreased energy 1 - -  Change in appetite 2 - -  Feeling bad or failure about yourself  0 - -  Trouble concentrating 1 - -  Moving slowly or fidgety/restless 1 - -  Suicidal thoughts 0 - -  PHQ-9 Score 5 - -  Difficult doing work/chores Somewhat difficult - -     . COVID-19 Education: The signs and symptoms of COVID-19 were discussed with the patient and how to seek care for testing if needed. The importance of social distancing was discussed today. . Reviewed  expectations re: course of current medical issues. . Discussed self-management of symptoms. . Outlined signs and symptoms indicating need for more acute intervention. . Patient verbalized understanding and all questions were answered. Marland Kitchen Health Maintenance issues including appropriate healthy diet, exercise, and smoking avoidance were discussed with patient. . See orders for this visit as documented in the electronic medical record.  Arnette Norris, MD  Records requested if needed. Time spent: 25 minutes, of which >50% was spent in obtaining information about her symptoms, reviewing her previous labs, evaluations, and treatments, counseling her about her condition (please see the discussed topics above), and developing a plan to further investigate it; she had a number of questions which I addressed.   Lab Results  Component Value Date   WBC 9.7 03/22/2019   HGB 14.9 03/22/2019   HCT 45.0 03/22/2019   PLT 316.0 03/22/2019   GLUCOSE 91 03/22/2019   CHOL 184 09/07/2017   TRIG 88.0 09/07/2017   HDL 61.00 09/07/2017   LDLCALC 105 (H) 09/07/2017   ALT 23 03/22/2019   AST 23 03/22/2019   NA 137 03/22/2019   K 3.7 03/22/2019   CL 103 03/22/2019   CREATININE 1.04 03/22/2019   BUN 6 03/22/2019   CO2  26 03/22/2019   TSH 0.99 09/07/2017    Lab Results  Component Value Date   TSH 0.99 09/07/2017   Lab Results  Component Value Date   WBC 9.7 03/22/2019   HGB 14.9 03/22/2019   HCT 45.0 03/22/2019   MCV 79.7 03/22/2019   PLT 316.0 03/22/2019   Lab Results  Component Value Date   NA 137 03/22/2019   K 3.7 03/22/2019   CO2 26 03/22/2019   GLUCOSE 91 03/22/2019   BUN 6 03/22/2019   CREATININE 1.04 03/22/2019   BILITOT 0.4 03/22/2019   ALKPHOS 55 03/22/2019   AST 23 03/22/2019   ALT 23 03/22/2019   PROT 6.9 03/22/2019   ALBUMIN 4.0 03/22/2019   CALCIUM 8.9 03/22/2019   ANIONGAP 4 (L) 05/01/2017   GFR 71.29 03/22/2019   Lab Results  Component Value Date   CHOL 184 09/07/2017    Lab Results  Component Value Date   HDL 61.00 09/07/2017   Lab Results  Component Value Date   LDLCALC 105 (H) 09/07/2017   Lab Results  Component Value Date   TRIG 88.0 09/07/2017   Lab Results  Component Value Date   CHOLHDL 3 09/07/2017   No results found for: HGBA1C     Assessment & Plan:   Problem List Items Addressed This Visit      Active Problems   Asthma, chronic    Deteriorated.She has a non-productive cough and usually past few weeks, Singulair isn't helping- this happens once or twice a year.  Xopenex helps for a few hours.  She has post-nasal-drip year round and uses nasal sprays. Gets SOB when walking around. Denies fatigue, headaches, nausea, diarrhea, or loss of taste or smell.   She will restart zyrtec, eRx sent for prednisone burst as this does not seem infectious at this point.  Call or send my chart message prn if these symptoms worsen or fail to improve as anticipated.  The patient indicates understanding of these issues and agrees with the plan.        Relevant Medications   predniSONE (DELTASONE) 10 MG tablet   Upper respiratory infection      I am having Suriya R. Brossard start on predniSONE. I am also having her maintain her cetirizine, SUMAtriptan, multivitamin with minerals, fluticasone, montelukast, levalbuterol, levalbuterol, LORazepam, and citalopram.  Meds ordered this encounter  Medications  . predniSONE (DELTASONE) 10 MG tablet    Sig: 3 tabs by mouth x 3 days, 2 tabs by mouth x 2 days, 1 tab by mouth x 2 days and stop.    Dispense:  15 tablet    Refill:  0     Arnette Norris, MD

## 2019-09-29 ENCOUNTER — Encounter: Payer: Self-pay | Admitting: Family Medicine

## 2019-09-29 ENCOUNTER — Telehealth (INDEPENDENT_AMBULATORY_CARE_PROVIDER_SITE_OTHER): Payer: 59 | Admitting: Family Medicine

## 2019-09-29 DIAGNOSIS — J069 Acute upper respiratory infection, unspecified: Secondary | ICD-10-CM | POA: Diagnosis not present

## 2019-09-29 DIAGNOSIS — J4540 Moderate persistent asthma, uncomplicated: Secondary | ICD-10-CM

## 2019-09-29 MED ORDER — LEVALBUTEROL HCL 0.63 MG/3ML IN NEBU: 0.6300 mg | INHALATION_SOLUTION | RESPIRATORY_TRACT | 2 refills | Status: AC | PRN

## 2019-09-29 MED ORDER — PREDNISONE 10 MG PO TABS: ORAL_TABLET | ORAL | 0 refills | Status: DC

## 2019-09-29 MED ORDER — LEVALBUTEROL TARTRATE 45 MCG/ACT IN AERO: INHALATION_SPRAY | RESPIRATORY_TRACT | 2 refills | Status: DC

## 2019-09-29 NOTE — Assessment & Plan Note (Signed)
Deteriorated.She has a non-productive cough and usually past few weeks, Singulair isn't helping- this happens once or twice a year.  Xopenex helps for a few hours.  She has post-nasal-drip year round and uses nasal sprays. Gets SOB when walking around. Denies fatigue, headaches, nausea, diarrhea, or loss of taste or smell.   She will restart zyrtec, eRx sent for prednisone burst as this does not seem infectious at this point.  Call or send my chart message prn if these symptoms worsen or fail to improve as anticipated.  The patient indicates understanding of these issues and agrees with the plan.

## 2019-10-10 ENCOUNTER — Encounter: Payer: Self-pay | Admitting: Family Medicine

## 2019-10-10 ENCOUNTER — Other Ambulatory Visit: Payer: Self-pay

## 2019-10-10 ENCOUNTER — Other Ambulatory Visit: Payer: Self-pay | Admitting: Family Medicine

## 2019-12-08 ENCOUNTER — Ambulatory Visit: Payer: 59 | Attending: Internal Medicine

## 2019-12-08 DIAGNOSIS — Z23 Encounter for immunization: Secondary | ICD-10-CM

## 2019-12-08 NOTE — Progress Notes (Signed)
   Covid-19 Vaccination Clinic  Name:  Mackenzie Shea    MRN: [id number] DOB: [birth year]  12/08/2019  Mackenzie Shea was observed post Covid-19 immunization for 15 minutes without incident. She was provided with Vaccine Information Sheet and instruction to access the V-Safe system.   Mackenzie Shea was instructed to call 911 with any severe reactions post vaccine: Marland Kitchen Difficulty breathing  . Swelling of face and throat  . A fast heartbeat  . A bad rash all over body  . Dizziness and weakness   Immunizations Administered    Name Date Dose VIS Date Route   Pfizer COVID-19 Vaccine 12/08/2019  5:01 PM 0.3 mL 08/12/2019 Intramuscular   Manufacturer: [place]   Lot: [id number]   [place]: [id number]

## 2020-01-02 ENCOUNTER — Ambulatory Visit: Payer: 59 | Attending: Internal Medicine

## 2020-01-02 DIAGNOSIS — Z23 Encounter for immunization: Secondary | ICD-10-CM

## 2020-01-02 NOTE — Progress Notes (Signed)
   Covid-19 Vaccination Clinic  Name:  Mackenzie Shea    MRN: [id number] DOB: 1981/05/01  01/02/2020  Mackenzie Shea was observed post Covid-19 immunization for 15 minutes without incident. She was provided with Vaccine Information Sheet and instruction to access the V-Safe system.   Mackenzie Shea was instructed to call 911 with any severe reactions post vaccine: Marland Kitchen Difficulty breathing  . Swelling of face and throat  . A fast heartbeat  . A bad rash all over body  . Dizziness and weakness   Immunizations Administered    Name Date Dose VIS Date Route   Pfizer COVID-19 Vaccine 01/02/2020  4:52 PM 0.3 mL 10/26/2018 Intramuscular   Manufacturer: [place]   Lot: [id number]   [place]: [id number]

## 2020-03-05 IMAGING — US ULTRASOUND ABDOMEN LIMITED
1 series · 14 of 25 positions shown · non-contrast
Comparison: None.

CLINICAL DATA: Right upper quadrant pain, bloating

EXAM:
ULTRASOUND ABDOMEN LIMITED RIGHT UPPER QUADRANT

[Series 1: ultrasound abdomen limited · 0.20mm/px · 14 of 61 slices shown]
[im 1/61]
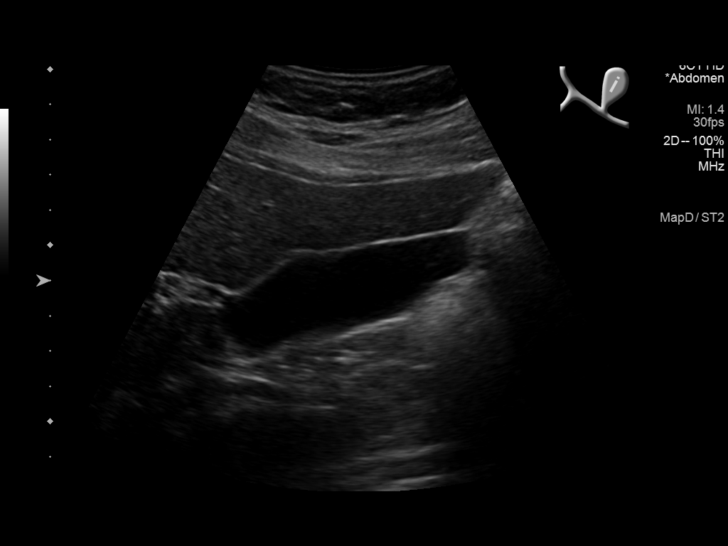
[im 6/61]
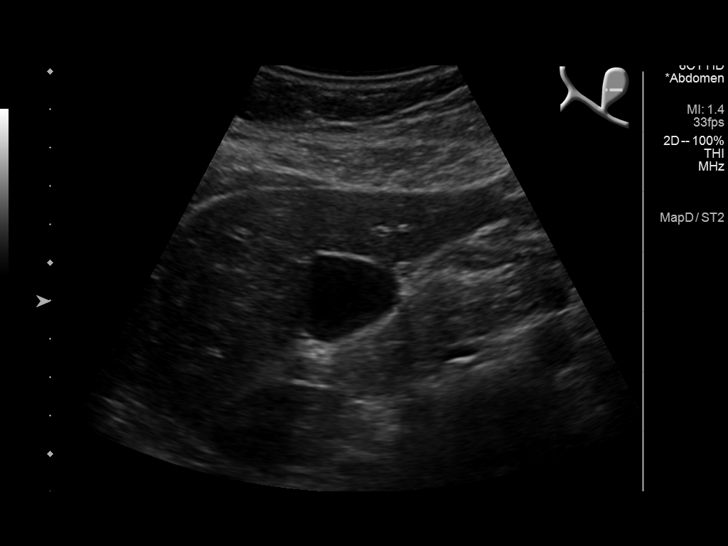
[im 11/61]
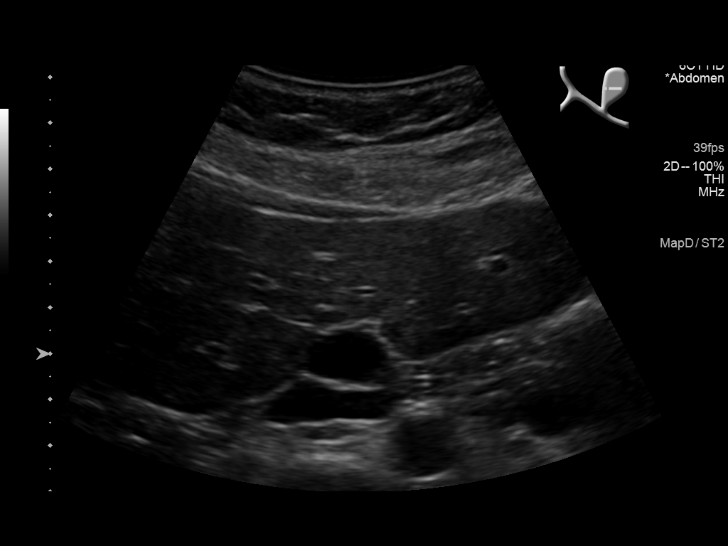
[im 16/61]
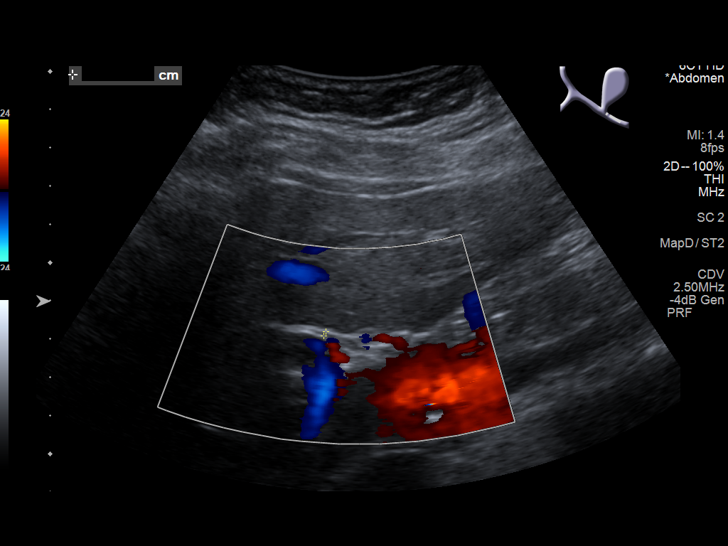
[im 21/61]
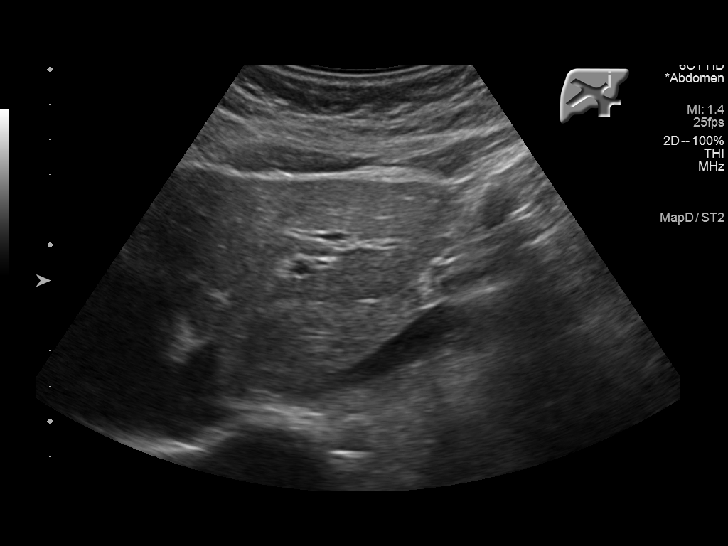
[im 23/61]
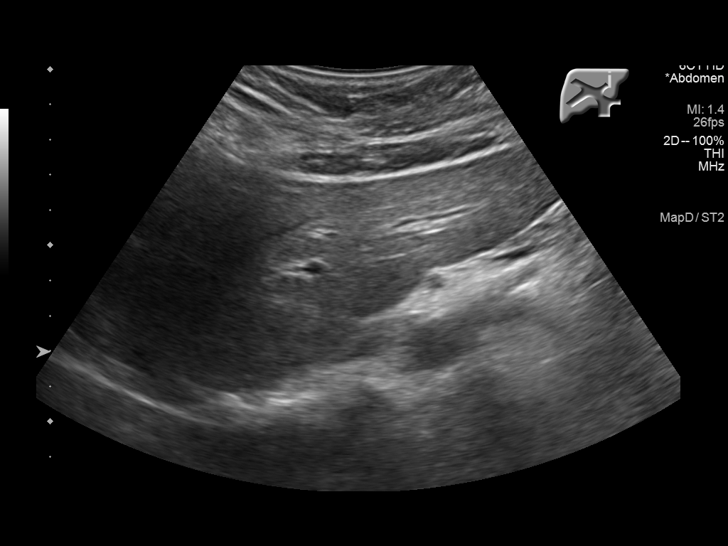
[im 28/61]
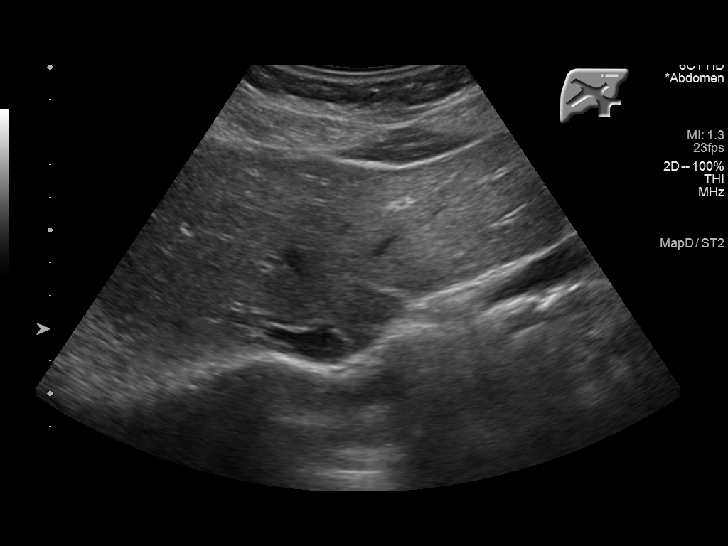
[im 33/61]
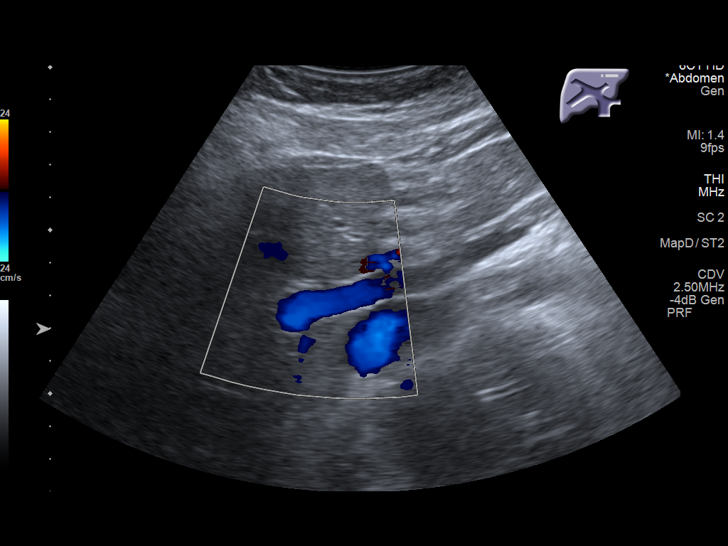
[im 38/61]
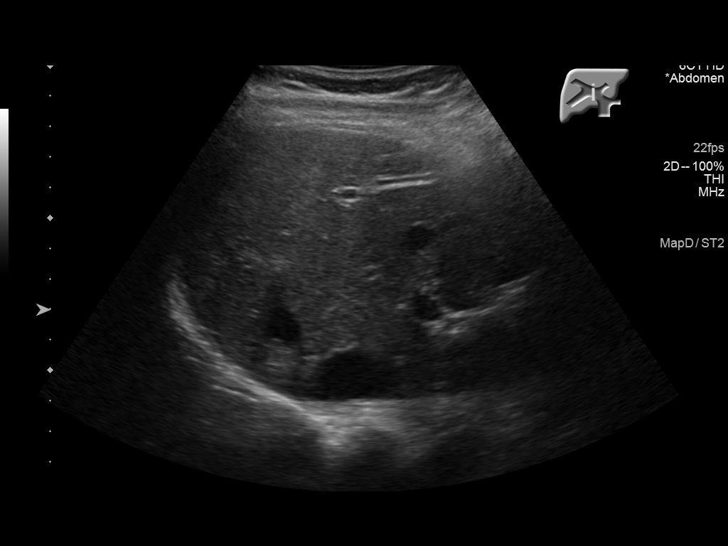
[im 41/61]
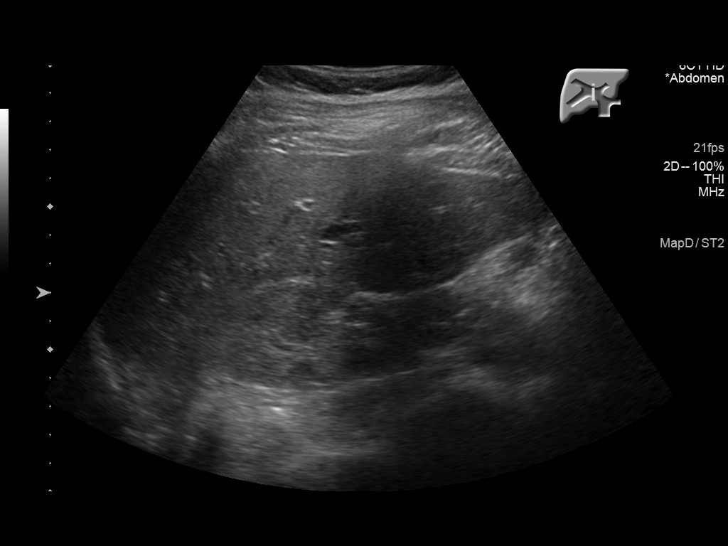
[im 46/61]
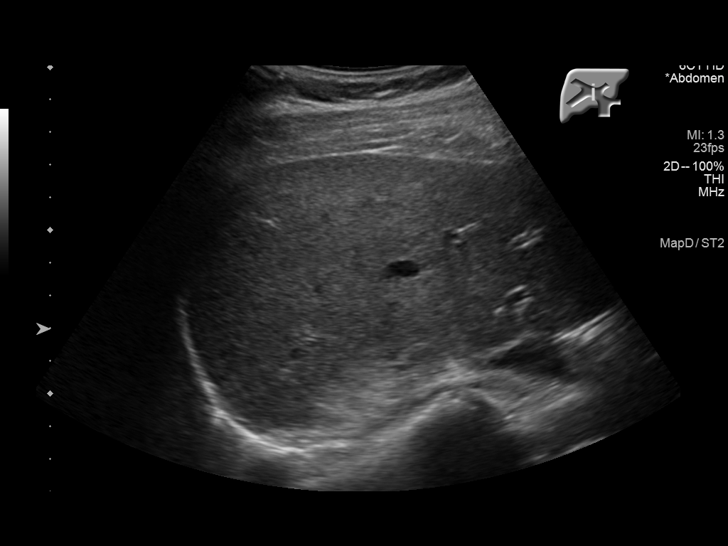
[im 51/61]
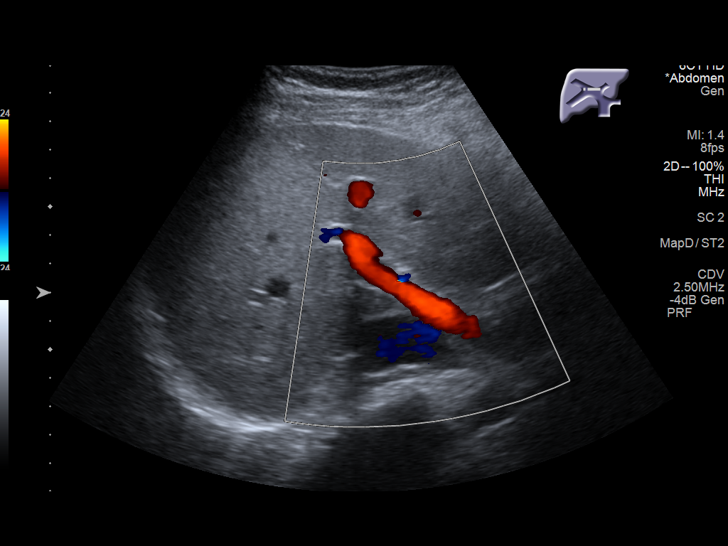
[im 56/61]
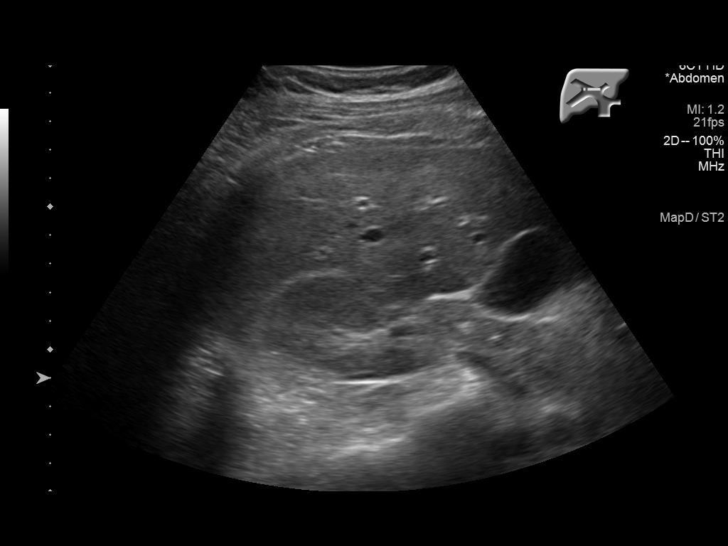
[im 61/61]
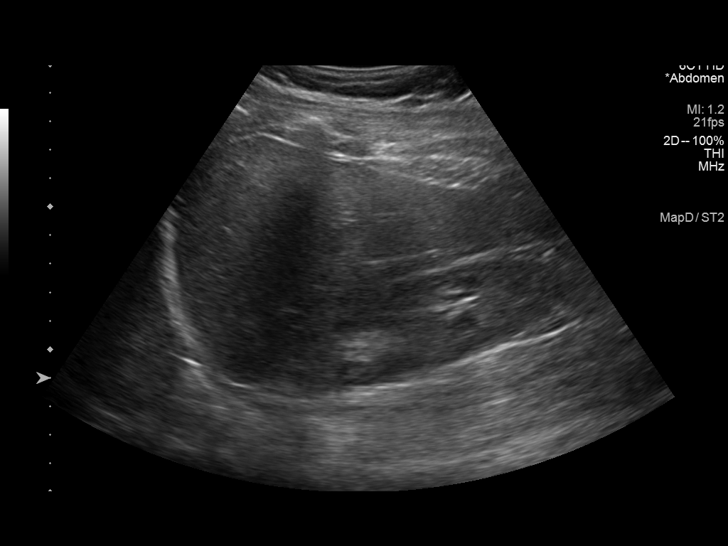

[14 of 25 positions shown; findings below may reference images not displayed]

FINDINGS: Gallbladder:

No gallstones or wall thickening visualized. No sonographic Murphy
sign noted by sonographer.

Common bile duct:

Diameter: Normal caliber, 1 mm

Liver:

No focal lesion identified. Within normal limits in parenchymal
echogenicity. Portal vein is patent on color Doppler imaging with
normal direction of blood flow towards the liver.
IMPRESSION: Normal study.

## 2020-05-02 ENCOUNTER — Other Ambulatory Visit: Payer: Self-pay

## 2020-05-03 ENCOUNTER — Encounter: Payer: Self-pay | Admitting: Family Medicine

## 2020-05-03 ENCOUNTER — Ambulatory Visit (INDEPENDENT_AMBULATORY_CARE_PROVIDER_SITE_OTHER): Payer: 59 | Admitting: Family Medicine

## 2020-05-03 VITALS — BP 110/68 | HR 95 | Temp 97.8°F | Ht 66.0 in | Wt 182.8 lb

## 2020-05-03 DIAGNOSIS — J4520 Mild intermittent asthma, uncomplicated: Secondary | ICD-10-CM | POA: Diagnosis not present

## 2020-05-03 DIAGNOSIS — F411 Generalized anxiety disorder: Secondary | ICD-10-CM

## 2020-05-03 DIAGNOSIS — G43909 Migraine, unspecified, not intractable, without status migrainosus: Secondary | ICD-10-CM | POA: Diagnosis not present

## 2020-05-03 DIAGNOSIS — Z1231 Encounter for screening mammogram for malignant neoplasm of breast: Secondary | ICD-10-CM

## 2020-05-03 MED ORDER — LEVALBUTEROL TARTRATE 45 MCG/ACT IN AERO: INHALATION_SPRAY | RESPIRATORY_TRACT | 2 refills | Status: DC

## 2020-05-03 MED ORDER — MONTELUKAST SODIUM 10 MG PO TABS: 10.0000 mg | ORAL_TABLET | Freq: Every day | ORAL | 3 refills | Status: DC

## 2020-05-03 MED ORDER — SUMATRIPTAN SUCCINATE 25 MG PO TABS: 50.0000 mg | ORAL_TABLET | ORAL | 2 refills | Status: DC | PRN

## 2020-05-03 MED ORDER — CITALOPRAM HYDROBROMIDE 40 MG PO TABS: 40.0000 mg | ORAL_TABLET | Freq: Every day | ORAL | 3 refills | Status: DC

## 2020-05-03 NOTE — Progress Notes (Signed)
Mackenzie Shea is a 39 y.o. female  Chief Complaint  Patient presents with  . Acute Visit    med refills-Celexa and Xopenex    HPI: Mackenzie Shea is a 39 y.o. female who is formerly a patient of Dr. Deborra Medina who is seen today for f/u on chronic medical issues including asthma, migraines, anxiety and med refills. No acute issues or concerns. Anxiety is well controlled on celexa 40mg , rare panic attack for which she takes lorazepam. Asthma overall controlled, uses inhaler when allergies flare which trigger her asthma. Migraines occur 1-2x/year.  Pt has CPE scheduled in 08/2020, will need fasting labs. Needs referral for mammo.  Past Medical History:  Diagnosis Date  . Allergic rhinitis, cause unspecified   . Allergy   . Anemia    history of  . Anxiety   . Elevated blood pressure reading without diagnosis of hypertension   . Esophageal reflux   . Extrinsic asthma, unspecified   . Leukocytosis, unspecified   . Pain in joint, shoulder region   . Personal history of traumatic fracture   . Tachycardia    with anxiety  . Unspecified vitamin D deficiency     Past Surgical History:  Procedure Laterality Date  . ABDOMINAL HYSTERECTOMY  05/01/2017   Dr. Hazle Coca @ [facility] (Complete)  . HYSTERECTOMY ABDOMINAL WITH SALPINGECTOMY Bilateral 04/30/2017   Procedure: HYSTERECTOMY ABDOMINAL WITH SALPINGECTOMY;  Surgeon: Dian Queen, MD;  Location: [facility];  Service: Gynecology;  Laterality: Bilateral;    Social History   Socioeconomic History  . Marital status: Married    Spouse name: Not on file  . Number of children: Not on file  . Years of education: Not on file  . Highest education level: Not on file  Occupational History  . Not on file  Tobacco Use  . Smoking status: Never Smoker  . Smokeless tobacco: Never Used  Vaping Use  . Vaping Use: Never used  Substance and Sexual Activity  . Alcohol use: No  . Drug use: No  . Sexual activity: Yes    Birth  control/protection: None  Other Topics Concern  . Not on file  Social History Narrative  . Not on file   Social Determinants of Health   Financial Resource Strain:   . Difficulty of Paying Living Expenses: Not on file  Food Insecurity:   . Worried About Charity fundraiser in the Last Year: Not on file  . Ran Out of Food in the Last Year: Not on file  Transportation Needs:   . Lack of Transportation (Medical): Not on file  . Lack of Transportation (Non-Medical): Not on file  Physical Activity:   . Days of Exercise per Week: Not on file  . Minutes of Exercise per Session: Not on file  Stress:   . Feeling of Stress : Not on file  Social Connections:   . Frequency of Communication with Friends and Family: Not on file  . Frequency of Social Gatherings with Friends and Family: Not on file  . Attends Religious Services: Not on file  . Active Member of Clubs or Organizations: Not on file  . Attends Archivist Meetings: Not on file  . Marital Status: Not on file  Intimate Partner Violence:   . Fear of Current or Ex-Partner: Not on file  . Emotionally Abused: Not on file  . Physically Abused: Not on file  . Sexually Abused: Not on file    Family History  Problem  Relation Age of Onset  . Heart disease Father      Immunization History  Administered Date(s) Administered  . PFIZER SARS-COV-2 Vaccination 12/08/2019, 01/02/2020  . PPD Test 12/28/2012  . Tdap 12/28/2012    Outpatient Encounter Medications as of 05/03/2020  Medication Sig  . cetirizine (ZYRTEC) 10 MG tablet Take 10 mg by mouth at bedtime as needed for allergies.   . fluticasone (VERAMYST) 27.5 MCG/SPRAY nasal spray Place 2 sprays into the nose daily.  Marland Kitchen levalbuterol (XOPENEX) 0.63 MG/3ML nebulizer solution Take 3 mLs (0.63 mg total) by nebulization every 4 (four) hours as needed for wheezing or shortness of breath.  Marland Kitchen LORazepam (ATIVAN) 0.5 MG tablet Take 1 tablet (0.5 mg total) by mouth 2 (two) times  daily.  . Multiple Vitamin (MULTIVITAMIN WITH MINERALS) TABS tablet Take 1 tablet by mouth daily.  . [DISCONTINUED] citalopram (CELEXA) 40 MG tablet Take 1 tablet (40 mg total) by mouth daily.  . [DISCONTINUED] levalbuterol (XOPENEX HFA) 45 MCG/ACT inhaler INHALE ONE TO TWO PUFFS INTO LUNGS EVERY 4 HOURS AS NEEDED  . [DISCONTINUED] montelukast (SINGULAIR) 10 MG tablet TAKE 1 TABLET BY MOUTH AT BEDTIME  . [DISCONTINUED] SUMAtriptan (IMITREX) 25 MG tablet Take 2 tablets (50 mg total) by mouth every 2 (two) hours as needed for migraine.  . citalopram (CELEXA) 40 MG tablet Take 1 tablet (40 mg total) by mouth daily.  Marland Kitchen levalbuterol (XOPENEX HFA) 45 MCG/ACT inhaler INHALE ONE TO TWO PUFFS INTO LUNGS EVERY 4 HOURS AS NEEDED  . montelukast (SINGULAIR) 10 MG tablet Take 1 tablet (10 mg total) by mouth at bedtime.  . SUMAtriptan (IMITREX) 25 MG tablet Take 2 tablets (50 mg total) by mouth every 2 (two) hours as needed for migraine.  . [DISCONTINUED] predniSONE (DELTASONE) 10 MG tablet 3 tabs by mouth x 3 days, 2 tabs by mouth x 2 days, 1 tab by mouth x 2 days and stop. (Patient not taking: Reported on 05/03/2020)   No facility-administered encounter medications on file as of 05/03/2020.     ROS: Pertinent positives and negatives noted in HPI. Remainder of ROS non-contributory    Allergies  Allergen Reactions  . Penicillins Hives    Has patient had a PCN reaction causing immediate rash, facial/tongue/throat swelling, SOB or lightheadedness with hypotension: No Has patient had a PCN reaction causing severe rash involving mucus membranes or skin necrosis: No Has patient had a PCN reaction that required hospitalization: No Has patient had a PCN reaction occurring within the last 10 years: No If all of the above answers are "NO", then may proceed with Cephalosporin use.   . Pseudoephedrine Hcl Palpitations    BP 110/68   Pulse 95   Temp 97.8 F (36.6 C) (Temporal)   Ht 5\' 6"  (1.676 m)   Wt 182 lb  12.8 oz (82.9 kg)   LMP 04/30/2017 (Exact Date)   SpO2 99%   BMI 29.50 kg/m   Physical Exam Constitutional:      General: She is not in acute distress.    Appearance: She is not ill-appearing.  Cardiovascular:     Rate and Rhythm: Normal rate and regular rhythm.     Pulses: Normal pulses.     Heart sounds: Normal heart sounds.  Pulmonary:     Effort: Pulmonary effort is normal. No respiratory distress.     Breath sounds: Normal breath sounds.  Neurological:     General: No focal deficit present.     Mental Status: She is alert and  oriented to person, place, and time.  Psychiatric:        Mood and Affect: Mood normal.        Behavior: Behavior normal.      A/P:  1. Anxiety state - overall well controlled - has rare panic attacks and takes PRN lorazepam Refill: - citalopram (CELEXA) 40 MG tablet; Take 1 tablet (40 mg total) by mouth daily.  Dispense: 90 tablet; Refill: 3  2. Mild intermittent chronic asthma without complication - stable, controlled Refill: - montelukast (SINGULAIR) 10 MG tablet; Take 1 tablet (10 mg total) by mouth at bedtime.  Dispense: 90 tablet; Refill: 3 - levalbuterol (XOPENEX HFA) 45 MCG/ACT inhaler; INHALE ONE TO TWO PUFFS INTO LUNGS EVERY 4 HOURS AS NEEDED  Dispense: 15 g; Refill: 2  3. Migraine without status migrainosus, not intractable, unspecified migraine type - 1-2 per year Refill: - SUMAtriptan (IMITREX) 25 MG tablet; Take 2 tablets (50 mg total) by mouth every 2 (two) hours as needed for migraine.  Dispense: 10 tablet; Refill: 2  4. Encounter for screening mammogram for malignant neoplasm of breast - MM DIGITAL SCREENING BILATERAL; Future   This visit occurred during the SARS-CoV-2 public health emergency.  Safety protocols were in place, including screening questions prior to the visit, additional usage of staff PPE, and extensive cleaning of exam room while observing appropriate contact time as indicated for disinfecting solutions.

## 2020-05-09 ENCOUNTER — Other Ambulatory Visit: Payer: Self-pay | Admitting: Family Medicine

## 2020-05-09 DIAGNOSIS — Z1231 Encounter for screening mammogram for malignant neoplasm of breast: Secondary | ICD-10-CM

## 2020-08-15 ENCOUNTER — Encounter: Payer: 59 | Admitting: Family Medicine

## 2020-10-02 ENCOUNTER — Other Ambulatory Visit: Payer: Self-pay

## 2020-10-03 ENCOUNTER — Ambulatory Visit (INDEPENDENT_AMBULATORY_CARE_PROVIDER_SITE_OTHER): Payer: 59 | Admitting: Family Medicine

## 2020-10-03 ENCOUNTER — Encounter: Payer: Self-pay | Admitting: Family Medicine

## 2020-10-03 VITALS — BP 130/78 | HR 77 | Temp 98.3°F | Ht 67.0 in | Wt 181.6 lb

## 2020-10-03 DIAGNOSIS — Z2821 Immunization not carried out because of patient refusal: Secondary | ICD-10-CM

## 2020-10-03 DIAGNOSIS — J4520 Mild intermittent asthma, uncomplicated: Secondary | ICD-10-CM

## 2020-10-03 DIAGNOSIS — E559 Vitamin D deficiency, unspecified: Secondary | ICD-10-CM

## 2020-10-03 DIAGNOSIS — F411 Generalized anxiety disorder: Secondary | ICD-10-CM

## 2020-10-03 DIAGNOSIS — Z0000 Encounter for general adult medical examination without abnormal findings: Secondary | ICD-10-CM | POA: Diagnosis not present

## 2020-10-03 DIAGNOSIS — G43909 Migraine, unspecified, not intractable, without status migrainosus: Secondary | ICD-10-CM

## 2020-10-03 DIAGNOSIS — J4540 Moderate persistent asthma, uncomplicated: Secondary | ICD-10-CM | POA: Diagnosis not present

## 2020-10-03 DIAGNOSIS — Z13220 Encounter for screening for lipoid disorders: Secondary | ICD-10-CM

## 2020-10-03 DIAGNOSIS — Z1329 Encounter for screening for other suspected endocrine disorder: Secondary | ICD-10-CM

## 2020-10-03 MED ORDER — MONTELUKAST SODIUM 10 MG PO TABS: 10.0000 mg | ORAL_TABLET | Freq: Every day | ORAL | 3 refills | Status: DC

## 2020-10-03 NOTE — Progress Notes (Signed)
Mackenzie Shea is a 40 y.o. female  Chief Complaint  Patient presents with  . Establish Care    TOC- CPE/labs.  No concerns.  Declines flu shot.    Pt arrived 8 min late to appt today.  HPI: Mackenzie Shea is a 40 y.o. female patient previously seen by Dr. Deborra Medina who comes in today for annual CPE, labs. She is not fasting today.   Last PAP: s/p total hysterectomy Last mammo: due   Diet/Exercise: "could be better" as far as diet; regular exercise Dental: UTD Vision: wear glasses - UTD  Med refills needed today? singulair 90 day supply   Past Medical History:  Diagnosis Date  . Allergic rhinitis, cause unspecified   . Allergy   . Anemia    history of  . Anxiety   . Elevated blood pressure reading without diagnosis of hypertension   . Esophageal reflux   . Extrinsic asthma, unspecified   . Leukocytosis, unspecified   . Pain in joint, shoulder region   . Personal history of traumatic fracture   . Tachycardia    with anxiety  . Unspecified vitamin D deficiency     Past Surgical History:  Procedure Laterality Date  . ABDOMINAL HYSTERECTOMY  05/01/2017   Dr. Hazle Coca @ [facility] (Complete)  . HYSTERECTOMY ABDOMINAL WITH SALPINGECTOMY Bilateral 04/30/2017   Procedure: HYSTERECTOMY ABDOMINAL WITH SALPINGECTOMY;  Surgeon: Dian Queen, MD;  Location: [facility];  Service: Gynecology;  Laterality: Bilateral;    Social History   Socioeconomic History  . Marital status: Married    Spouse name: Not on file  . Number of children: Not on file  . Years of education: Not on file  . Highest education level: Not on file  Occupational History  . Not on file  Tobacco Use  . Smoking status: Never Smoker  . Smokeless tobacco: Never Used  Vaping Use  . Vaping Use: Never used  Substance and Sexual Activity  . Alcohol use: No  . Drug use: No  . Sexual activity: Yes    Birth control/protection: None  Other Topics Concern  . Not on file  Social History Narrative   . Not on file   Social Determinants of Health   Financial Resource Strain: Not on file  Food Insecurity: Not on file  Transportation Needs: Not on file  Physical Activity: Not on file  Stress: Not on file  Social Connections: Not on file  Intimate Partner Violence: Not on file    Family History  Problem Relation Age of Onset  . Heart disease Father      Immunization History  Administered Date(s) Administered  . PFIZER(Purple Top)SARS-COV-2 Vaccination 12/08/2019, 01/02/2020, 09/11/2020  . PPD Test 12/28/2012  . Tdap 12/28/2012    Outpatient Encounter Medications as of 10/03/2020  Medication Sig  . cetirizine (ZYRTEC) 10 MG tablet Take 10 mg by mouth at bedtime as needed for allergies.   . citalopram (CELEXA) 40 MG tablet Take 1 tablet (40 mg total) by mouth daily.  . fluticasone (VERAMYST) 27.5 MCG/SPRAY nasal spray Place 2 sprays into the nose daily.  Marland Kitchen levalbuterol (XOPENEX HFA) 45 MCG/ACT inhaler INHALE ONE TO TWO PUFFS INTO LUNGS EVERY 4 HOURS AS NEEDED  . levalbuterol (XOPENEX) 0.63 MG/3ML nebulizer solution Take 3 mLs (0.63 mg total) by nebulization every 4 (four) hours as needed for wheezing or shortness of breath.  Marland Kitchen LORazepam (ATIVAN) 0.5 MG tablet Take 1 tablet (0.5 mg total) by mouth 2 (two) times daily.  Marland Kitchen  Multiple Vitamin (MULTIVITAMIN WITH MINERALS) TABS tablet Take 1 tablet by mouth daily.  . SUMAtriptan (IMITREX) 25 MG tablet Take 2 tablets (50 mg total) by mouth every 2 (two) hours as needed for migraine.  . [DISCONTINUED] montelukast (SINGULAIR) 10 MG tablet Take 1 tablet (10 mg total) by mouth at bedtime.  . montelukast (SINGULAIR) 10 MG tablet Take 1 tablet (10 mg total) by mouth at bedtime.  . [DISCONTINUED] montelukast (SINGULAIR) 10 MG tablet Take 1 tablet (10 mg total) by mouth at bedtime.   No facility-administered encounter medications on file as of 10/03/2020.     ROS: Gen: no fever, chills  Skin: no rash, itching ENT: no ear pain, ear drainage,  nasal congestion, rhinorrhea, sinus pressure, sore throat Eyes: no blurry vision, double vision Resp: no cough, wheeze,SOB CV: no CP, + intermittent palpitations, no LE edema GI: no heartburn, n/v/d/c, abd pain GU: no dysuria, urgency, frequency, hematuria MSK: no joint pain, myalgias, back pain Neuro: no dizziness, headache, weakness, vertigo Psych: no depression, + anxiety, no insomnia   Allergies  Allergen Reactions  . Penicillins Hives    Has patient had a PCN reaction causing immediate rash, facial/tongue/throat swelling, SOB or lightheadedness with hypotension: No Has patient had a PCN reaction causing severe rash involving mucus membranes or skin necrosis: No Has patient had a PCN reaction that required hospitalization: No Has patient had a PCN reaction occurring within the last 10 years: No If all of the above answers are "NO", then may proceed with Cephalosporin use.   . Pseudoephedrine Hcl Palpitations    BP 130/78   Pulse 77   Temp 98.3 F (36.8 C) (Temporal)   Ht 5\' 7"  (1.702 m)   Wt 181 lb 9.6 oz (82.4 kg)   LMP 04/30/2017 (Exact Date)   SpO2 98%   BMI 28.44 kg/m   BP Readings from Last 3 Encounters:  10/03/20 130/78  05/03/20 110/68  09/29/19 115/80   Pulse Readings from Last 3 Encounters:  10/03/20 77  05/03/20 95  04/30/18 (!) 109   Wt Readings from Last 3 Encounters:  10/03/20 181 lb 9.6 oz (82.4 kg)  05/03/20 182 lb 12.8 oz (82.9 kg)  12/15/18 168 lb (76.2 kg)    Physical Exam Constitutional:      General: She is not in acute distress.    Appearance: She is well-developed and well-nourished.  HENT:     Head: Normocephalic and atraumatic.     Right Ear: Tympanic membrane and ear canal normal.     Left Ear: Tympanic membrane and ear canal normal.     Nose: Nose normal.     Mouth/Throat:     Mouth: Oropharynx is clear and moist and mucous membranes are normal.  Eyes:     Conjunctiva/sclera: Conjunctivae normal.     Pupils: Pupils are  equal, round, and reactive to light.  Neck:     Thyroid: No thyromegaly.  Cardiovascular:     Rate and Rhythm: Normal rate and regular rhythm.     Pulses: Intact distal pulses.     Heart sounds: Normal heart sounds. No murmur heard.   Pulmonary:     Effort: Pulmonary effort is normal. No respiratory distress.     Breath sounds: Normal breath sounds. No wheezing or rhonchi.  Abdominal:     General: Bowel sounds are normal. There is no distension.     Palpations: Abdomen is soft. There is no mass.     Tenderness: There is no abdominal tenderness.  Musculoskeletal:        General: No edema.     Cervical back: Neck supple.     Right lower leg: No edema.     Left lower leg: No edema.  Lymphadenopathy:     Cervical: No cervical adenopathy.  Skin:    General: Skin is warm and dry.  Neurological:     Mental Status: She is alert and oriented to person, place, and time.     Motor: No abnormal muscle tone.     Coordination: Coordination normal.  Psychiatric:        Mood and Affect: Mood and affect normal.        Behavior: Behavior normal.      A/P:  1. Annual physical exam - discussed importance of regular CV exercise, healthy diet, adequate sleep - due for mammo; s/p TAH so no PAP - UTD on dental and vision - declines flu vaccine - CBC; Future - Basic metabolic panel; Future - AST; Future - ALT; Future - next CPE in 1 year  2. Vitamin D deficiency - does not take daily OTC Vit D - VITAMIN D 25 Hydroxy (Vit-D Deficiency, Fractures); Future  3. GAD (generalized anxiety disorder) - controlled, at goal - cont celexa 40mg  daily  4. Moderate persistent chronic asthma without complication - well-controlled, no recent exacerbations Refill: - montelukast (SINGULAIR) 10 MG tablet; Take 1 tablet (10 mg total) by mouth at bedtime.  Dispense: 90 tablet; Refill: 3  5. Migraine without status migrainosus, not intractable, unspecified migraine type - stable, at baseline - takes  imitrex PRN  6. Screening for lipid disorders - Lipid panel; Future  7. Screening for thyroid disorder - TSH; Future  8. Influenza vaccination declined by patient    This visit occurred during the SARS-CoV-2 public health emergency.  Safety protocols were in place, including screening questions prior to the visit, additional usage of staff PPE, and extensive cleaning of exam room while observing appropriate contact time as indicated for disinfecting solutions.

## 2020-10-03 NOTE — Patient Instructions (Signed)
[facility]. [phone].  ° °

## 2020-10-04 ENCOUNTER — Other Ambulatory Visit: Payer: Self-pay

## 2020-10-04 ENCOUNTER — Other Ambulatory Visit (INDEPENDENT_AMBULATORY_CARE_PROVIDER_SITE_OTHER): Payer: 59

## 2020-10-04 DIAGNOSIS — E559 Vitamin D deficiency, unspecified: Secondary | ICD-10-CM | POA: Diagnosis not present

## 2020-10-04 DIAGNOSIS — Z13220 Encounter for screening for lipoid disorders: Secondary | ICD-10-CM

## 2020-10-04 DIAGNOSIS — Z0000 Encounter for general adult medical examination without abnormal findings: Secondary | ICD-10-CM

## 2020-10-04 DIAGNOSIS — Z1329 Encounter for screening for other suspected endocrine disorder: Secondary | ICD-10-CM

## 2020-10-04 LAB — LIPID PANEL
Cholesterol: 188 mg/dL (ref 0–200)
HDL: 56.3 mg/dL (ref >39.00)
LDL Cholesterol: 116 mg/dL — ABNORMAL HIGH (ref 0–99)
NonHDL: 131.92
Total CHOL/HDL Ratio: 3
Triglycerides: 80 mg/dL (ref 0.0–149.0)
VLDL: 16 mg/dL (ref 0.0–40.0)

## 2020-10-04 LAB — CBC
HCT: 44.8 % (ref 36.0–46.0)
Hemoglobin: 14.9 g/dL (ref 12.0–15.0)
MCHC: 33.2 g/dL (ref 30.0–36.0)
MCV: 80.5 fl (ref 78.0–100.0)
Platelets: 276 10*3/uL (ref 150.0–400.0)
RBC: 5.56 Mil/uL — ABNORMAL HIGH (ref 3.87–5.11)
RDW: 12.9 % (ref 11.5–15.5)
WBC: 9.9 10*3/uL (ref 4.0–10.5)

## 2020-10-04 LAB — TSH: TSH: 0.84 u[IU]/mL (ref 0.35–4.50)

## 2020-10-04 LAB — ALT: ALT: 14 U/L (ref 0–35)

## 2020-10-04 LAB — BASIC METABOLIC PANEL
BUN: 9 mg/dL (ref 6–23)
CO2: 31 mEq/L (ref 19–32)
Calcium: 9.5 mg/dL (ref 8.4–10.5)
Chloride: 104 mEq/L (ref 96–112)
Creatinine, Ser: 0.89 mg/dL (ref 0.40–1.20)
GFR: 80.89 mL/min (ref >60.00)
Glucose, Bld: 83 mg/dL (ref 70–99)
Potassium: 4.6 mEq/L (ref 3.5–5.1)
Sodium: 137 mEq/L (ref 135–145)

## 2020-10-04 LAB — VITAMIN D 25 HYDROXY (VIT D DEFICIENCY, FRACTURES): VITD: 34.56 ng/mL (ref 30.00–100.00)

## 2020-10-04 LAB — AST: AST: 15 U/L (ref 0–37)

## 2020-10-05 ENCOUNTER — Encounter: Payer: Self-pay | Admitting: Family Medicine

## 2021-05-21 ENCOUNTER — Telehealth: Payer: Self-pay | Admitting: Family Medicine

## 2021-05-21 NOTE — Telephone Encounter (Signed)
Called patient regarding refill request, patient informed me that she was completely out of medication. She also states that she would prefer a female doctor and would like to know if she could have a TOC to Dr. Diona Browner. Please check with [facility] to see she's taking any patients and check on patient being scheduled with Dr. Diona Browner for follow up on medication/refill and TOC.

## 2021-05-22 ENCOUNTER — Other Ambulatory Visit: Payer: Self-pay | Admitting: Family Medicine

## 2021-05-22 DIAGNOSIS — F411 Generalized anxiety disorder: Secondary | ICD-10-CM

## 2021-05-22 MED ORDER — CITALOPRAM HYDROBROMIDE 40 MG PO TABS: 40.0000 mg | ORAL_TABLET | Freq: Every day | ORAL | 0 refills | Status: DC

## 2021-05-22 NOTE — Telephone Encounter (Signed)
Lft VM  that RX was sent to the pharmacy. Dm/cma  

## 2021-05-22 NOTE — Telephone Encounter (Signed)
Please see other message Dm/cma  

## 2021-05-22 NOTE — Telephone Encounter (Signed)
Refill request for: Citalopram 40 mg  LR 05/23/20, #90.3 rf LOV 10/03/20 FOV  08/01/21 w/ [facility]  Patient states she is out of meds.   Please review and advise.  Thanks.  Dm/cma

## 2021-06-19 NOTE — Telephone Encounter (Signed)
Pt called to f/u on request to [facility] to Dr. Diona Browner with [facility]. She lives in [place] and used to see Dr. Deborra Medina. She would like to re-establish with [facility] office (female MD).

## 2021-08-01 ENCOUNTER — Encounter: Payer: Self-pay | Admitting: Family Medicine

## 2021-08-01 ENCOUNTER — Encounter: Payer: 59 | Admitting: Nurse Practitioner

## 2021-08-01 DIAGNOSIS — J4520 Mild intermittent asthma, uncomplicated: Secondary | ICD-10-CM

## 2021-08-02 MED ORDER — LEVALBUTEROL TARTRATE 45 MCG/ACT IN AERO: INHALATION_SPRAY | RESPIRATORY_TRACT | 0 refills | Status: DC

## 2021-08-09 ENCOUNTER — Ambulatory Visit (INDEPENDENT_AMBULATORY_CARE_PROVIDER_SITE_OTHER): Payer: 59 | Admitting: Family Medicine

## 2021-08-09 ENCOUNTER — Encounter: Payer: Self-pay | Admitting: Family Medicine

## 2021-08-09 ENCOUNTER — Other Ambulatory Visit: Payer: Self-pay

## 2021-08-09 DIAGNOSIS — J4520 Mild intermittent asthma, uncomplicated: Secondary | ICD-10-CM | POA: Diagnosis not present

## 2021-08-09 DIAGNOSIS — I10 Essential (primary) hypertension: Secondary | ICD-10-CM

## 2021-08-09 DIAGNOSIS — E559 Vitamin D deficiency, unspecified: Secondary | ICD-10-CM | POA: Diagnosis not present

## 2021-08-09 DIAGNOSIS — I479 Paroxysmal tachycardia, unspecified: Secondary | ICD-10-CM

## 2021-08-09 DIAGNOSIS — G43009 Migraine without aura, not intractable, without status migrainosus: Secondary | ICD-10-CM | POA: Diagnosis not present

## 2021-08-09 DIAGNOSIS — F411 Generalized anxiety disorder: Secondary | ICD-10-CM

## 2021-08-09 NOTE — Assessment & Plan Note (Signed)
Rare occurrence.  Has imitrex to use prn.

## 2021-08-09 NOTE — Patient Instructions (Signed)
Please call the location of your choice from the menu below to schedule your Mammogram and/or Bone Density appointment.    [facility]                      Phone:  [phone] [address]                               [place], [place] [address]                                                             Services: Traditional and 3D Mammogram, Bone Density   [facility]                 Phone: [phone] [address]                                                       [place], [place] [address]    Service: Bone Density ONLY   *this site does NOT perform mammograms  [facility]                        Phone:  [phone] [address]                                  [place], [place] [address]                                            Services:  3D Mammogram and Bone Density    

## 2021-08-09 NOTE — Assessment & Plan Note (Signed)
Chronic, usually well managed, only flares with viral illness or allergies.  Using Singulair.   Xopenex inhaler prn.

## 2021-08-09 NOTE — Progress Notes (Signed)
Patient ID: Mackenzie Shea, female    DOB: Mar 31, 1981, 40 y.o.   MRN: [id number]  This visit was conducted in person.  BP 104/70   Pulse 96   Temp 97.7 F (36.5 C) (Temporal)   Ht 5\' 6"  (1.676 m)   Wt 177 lb 8 oz (80.5 kg)   LMP 04/30/2017 (Exact Date)   SpO2 98%   BMI 28.65 kg/m    CC: Chief Complaint  Patient presents with   Establish Care    Subjective:   HPI: Mackenzie Shea is a 40 y.o. female presenting on 08/09/2021 for Establish Care   Previously saw  Deborra Medina, then temporarily Dr. Linford Arnold.  Want to transfer closer to home.   Last PCP 10/2020.  Wife is patient mine.     VIt D def:  taking multivitamin.   Asthma, mild intermittent: controlled with  zyrtec, nasal steroid,singulair. Recent flare with flu, better now.   GAD: stable control on celexa 40 mg daily  Using lorazepam 0.5 mg BID .Marland Kitchen 2-3 in last 6 months.  Migraine/tension headaches: rarely using imitrex.   Relevant past medical, surgical, family and social history reviewed and updated as indicated. Interim medical history since our last visit reviewed. Allergies and medications reviewed and updated. Outpatient Medications Prior to Visit  Medication Sig Dispense Refill   cetirizine (ZYRTEC) 10 MG tablet Take 10 mg by mouth at bedtime as needed for allergies.      citalopram (CELEXA) 40 MG tablet Take 1 tablet (40 mg total) by mouth daily. 90 tablet 0   levalbuterol (XOPENEX HFA) 45 MCG/ACT inhaler INHALE ONE TO TWO PUFFS INTO LUNGS EVERY 4 HOURS AS NEEDED 15 g 0   levalbuterol (XOPENEX) 0.63 MG/3ML nebulizer solution Take 3 mLs (0.63 mg total) by nebulization every 4 (four) hours as needed for wheezing or shortness of breath. 3 mL 2   LORazepam (ATIVAN) 0.5 MG tablet Take 1 tablet (0.5 mg total) by mouth 2 (two) times daily. 60 tablet 5   montelukast (SINGULAIR) 10 MG tablet Take 1 tablet (10 mg total) by mouth at bedtime. 90 tablet 3   Multiple Vitamin (MULTIVITAMIN WITH MINERALS) TABS tablet Take 1  tablet by mouth daily.     SUMAtriptan (IMITREX) 25 MG tablet Take 2 tablets (50 mg total) by mouth every 2 (two) hours as needed for migraine. 10 tablet 2   fluticasone (VERAMYST) 27.5 MCG/SPRAY nasal spray Place 2 sprays into the nose daily.     No facility-administered medications prior to visit.     Per HPI unless specifically indicated in ROS section below Review of Systems  Constitutional:  Negative for fatigue and fever.  HENT:  Negative for congestion.   Eyes:  Negative for pain.  Respiratory:  Negative for cough and shortness of breath.   Cardiovascular:  Negative for chest pain, palpitations and leg swelling.  Gastrointestinal:  Negative for abdominal pain.  Genitourinary:  Negative for dysuria and vaginal bleeding.  Musculoskeletal:  Negative for back pain.  Neurological:  Negative for syncope, light-headedness and headaches.  Psychiatric/Behavioral:  Negative for dysphoric mood.   Objective:  BP 104/70   Pulse 96   Temp 97.7 F (36.5 C) (Temporal)   Ht 5\' 6"  (1.676 m)   Wt 177 lb 8 oz (80.5 kg)   LMP 04/30/2017 (Exact Date)   SpO2 98%   BMI 28.65 kg/m   Wt Readings from Last 3 Encounters:  08/09/21 177 lb 8 oz (80.5 kg)  10/03/20 181 lb  9.6 oz (82.4 kg)  05/03/20 182 lb 12.8 oz (82.9 kg)      Physical Exam Constitutional:      General: She is not in acute distress.    Appearance: Normal appearance. She is well-developed. She is not ill-appearing or toxic-appearing.  HENT:     Head: Normocephalic.     Right Ear: Hearing, tympanic membrane, ear canal and external ear normal. Tympanic membrane is not erythematous, retracted or bulging.     Left Ear: Hearing, tympanic membrane, ear canal and external ear normal. Tympanic membrane is not erythematous, retracted or bulging.     Nose: No mucosal edema or rhinorrhea.     Right Sinus: No maxillary sinus tenderness or frontal sinus tenderness.     Left Sinus: No maxillary sinus tenderness or frontal sinus tenderness.      Mouth/Throat:     Pharynx: Uvula midline.  Eyes:     General: Lids are normal. Lids are everted, no foreign bodies appreciated.     Conjunctiva/sclera: Conjunctivae normal.     Pupils: Pupils are equal, round, and reactive to light.  Neck:     Thyroid: No thyroid mass or thyromegaly.     Vascular: No carotid bruit.     Trachea: Trachea normal.  Cardiovascular:     Rate and Rhythm: Normal rate and regular rhythm.     Pulses: Normal pulses.     Heart sounds: Normal heart sounds, S1 normal and S2 normal. No murmur heard.   No friction rub. No gallop.  Pulmonary:     Effort: Pulmonary effort is normal. No tachypnea or respiratory distress.     Breath sounds: Normal breath sounds. No decreased breath sounds, wheezing, rhonchi or rales.  Abdominal:     General: Bowel sounds are normal.     Palpations: Abdomen is soft.     Tenderness: There is no abdominal tenderness.  Musculoskeletal:     Cervical back: Normal range of motion and neck supple.  Skin:    General: Skin is warm and dry.     Findings: No rash.  Neurological:     Mental Status: She is alert.  Psychiatric:        Mood and Affect: Mood is not anxious or depressed.        Speech: Speech normal.        Behavior: Behavior normal. Behavior is cooperative.        Thought Content: Thought content normal.        Judgment: Judgment normal.      Results for orders placed or performed in visit on 10/04/20  TSH  Result Value Ref Range   TSH 0.84 0.35 - 4.50 uIU/mL  ALT  Result Value Ref Range   ALT 14 0 - 35 U/L  AST  Result Value Ref Range   AST 15 0 - 37 U/L  Basic metabolic panel  Result Value Ref Range   Sodium 137 135 - 145 mEq/L   Potassium 4.6 3.5 - 5.1 mEq/L   Chloride 104 96 - 112 mEq/L   CO2 31 19 - 32 mEq/L   Glucose, Bld 83 70 - 99 mg/dL   BUN 9 6 - 23 mg/dL   Creatinine, Ser 0.89 0.40 - 1.20 mg/dL   GFR 80.89 >60.00 mL/min   Calcium 9.5 8.4 - 10.5 mg/dL  CBC  Result Value Ref Range   WBC 9.9 4.0 -  10.5 K/uL   RBC 5.56 (H) 3.87 - 5.11 Mil/uL   Platelets 276.0  150.0 - 400.0 K/uL   Hemoglobin 14.9 12.0 - 15.0 g/dL   HCT 44.8 36.0 - 46.0 %   MCV 80.5 78.0 - 100.0 fl   MCHC 33.2 30.0 - 36.0 g/dL   RDW 12.9 11.5 - 15.5 %  VITAMIN D 25 Hydroxy (Vit-D Deficiency, Fractures)  Result Value Ref Range   VITD 34.56 30.00 - 100.00 ng/mL  Lipid panel  Result Value Ref Range   Cholesterol 188 0 - 200 mg/dL   Triglycerides 80.0 0.0 - 149.0 mg/dL   HDL 56.30 >39.00 mg/dL   VLDL 16.0 0.0 - 40.0 mg/dL   LDL Cholesterol 116 (H) 0 - 99 mg/dL   Total CHOL/HDL Ratio 3    NonHDL 131.92     This visit occurred during the SARS-CoV-2 public health emergency.  Safety protocols were in place, including screening questions prior to the visit, additional usage of staff PPE, and extensive cleaning of exam room while observing appropriate contact time as indicated for disinfecting solutions.   COVID 19 screen:  No recent travel or known exposure to COVID19 The patient denies respiratory symptoms of COVID 19 at this time. The importance of social distancing was discussed today.   Assessment and Plan    Problem List Items Addressed This Visit     Common migraine     Rare occurrence.  Has imitrex to use prn.      GAD (generalized anxiety disorder)    Stable, chronic.  Continue current medication.   Celexa 40 mg daily  Lorazepam rarely      Mild intermittent asthma     Chronic, usually well managed, only flares with viral illness or allergies.  Using Singulair.   Xopenex inhaler prn.      Paroxysmal tachycardia ([place])     Off and on likely connected to anxiety.   has had negative cardiology Dr. Rockey Situ workup in 2012      Vitamin D deficiency     Stable, chronic. Taking multivitamin.        Eliezer Lofts, MD

## 2021-08-09 NOTE — Assessment & Plan Note (Signed)
Stable, chronic. Taking multivitamin.

## 2021-08-09 NOTE — Assessment & Plan Note (Signed)
Stable, chronic.  Continue current medication.   Celexa 40 mg daily  Lorazepam rarely

## 2021-08-09 NOTE — Assessment & Plan Note (Signed)
Off and on likely connected to anxiety.   has had negative cardiology Dr. Rockey Situ workup in 2012

## 2021-09-03 ENCOUNTER — Other Ambulatory Visit: Payer: Self-pay | Admitting: Family

## 2021-09-03 ENCOUNTER — Encounter: Payer: Self-pay | Admitting: Family Medicine

## 2021-09-03 DIAGNOSIS — F411 Generalized anxiety disorder: Secondary | ICD-10-CM

## 2021-09-03 MED ORDER — CITALOPRAM HYDROBROMIDE 40 MG PO TABS: 40.0000 mg | ORAL_TABLET | Freq: Every day | ORAL | 0 refills | Status: DC

## 2021-09-26 NOTE — Assessment & Plan Note (Signed)
BP in normal range in office today on no medication. BP Readings from Last 3 Encounters:  08/09/21 104/70  10/03/20 130/78  05/03/20 110/68

## 2021-10-28 ENCOUNTER — Telehealth: Payer: Self-pay | Admitting: Family Medicine

## 2021-10-28 DIAGNOSIS — I10 Essential (primary) hypertension: Secondary | ICD-10-CM

## 2021-10-28 DIAGNOSIS — E559 Vitamin D deficiency, unspecified: Secondary | ICD-10-CM

## 2021-10-28 NOTE — Telephone Encounter (Signed)
-----   Message from Ellamae Sia sent at 10/28/2021  5:10 PM EST ----- Regarding: Lab orders for Tuesday, 3.7.23 Patient is scheduled for CPX labs, please order future labs, Thanks , Karna Christmas

## 2021-11-05 ENCOUNTER — Telehealth: Payer: Self-pay | Admitting: Family Medicine

## 2021-11-05 ENCOUNTER — Other Ambulatory Visit: Payer: 59

## 2021-11-05 ENCOUNTER — Other Ambulatory Visit: Payer: Self-pay

## 2021-11-05 ENCOUNTER — Other Ambulatory Visit (INDEPENDENT_AMBULATORY_CARE_PROVIDER_SITE_OTHER): Payer: 59

## 2021-11-05 DIAGNOSIS — E559 Vitamin D deficiency, unspecified: Secondary | ICD-10-CM

## 2021-11-05 DIAGNOSIS — I10 Essential (primary) hypertension: Secondary | ICD-10-CM | POA: Diagnosis not present

## 2021-11-05 LAB — COMPREHENSIVE METABOLIC PANEL
ALT: 15 U/L (ref 0–35)
AST: 18 U/L (ref 0–37)
Albumin: 4.1 g/dL (ref 3.5–5.2)
Alkaline Phosphatase: 46 U/L (ref 39–117)
BUN: 16 mg/dL (ref 6–23)
CO2: 26 mEq/L (ref 19–32)
Calcium: 9 mg/dL (ref 8.4–10.5)
Chloride: 103 mEq/L (ref 96–112)
Creatinine, Ser: 0.84 mg/dL (ref 0.40–1.20)
GFR: 86.04 mL/min (ref >60.00)
Glucose, Bld: 79 mg/dL (ref 70–99)
Potassium: 4.1 mEq/L (ref 3.5–5.1)
Sodium: 135 mEq/L (ref 135–145)
Total Bilirubin: 0.4 mg/dL (ref 0.2–1.2)
Total Protein: 6.9 g/dL (ref 6.0–8.3)

## 2021-11-05 LAB — LIPID PANEL
Cholesterol: 166 mg/dL (ref 0–200)
HDL: 58.7 mg/dL (ref >39.00)
LDL Cholesterol: 93 mg/dL (ref 0–99)
NonHDL: 107.31
Total CHOL/HDL Ratio: 3
Triglycerides: 71 mg/dL (ref 0.0–149.0)
VLDL: 14.2 mg/dL (ref 0.0–40.0)

## 2021-11-05 LAB — VITAMIN D 25 HYDROXY (VIT D DEFICIENCY, FRACTURES): VITD: 56.61 ng/mL (ref 30.00–100.00)

## 2021-11-05 NOTE — Telephone Encounter (Signed)
FYI:  ?Pt showed up at [facility] for Lab appointment and we did them here.  ?(The appointment was 3/7 at 830, I rescheduled it for our schedule.) ?

## 2021-11-05 NOTE — Progress Notes (Signed)
No critical labs need to be addressed urgently. We will discuss labs in detail at upcoming office visit.   

## 2021-11-05 NOTE — Telephone Encounter (Signed)
Noted. Thank You.

## 2021-11-08 ENCOUNTER — Encounter: Payer: Self-pay | Admitting: Family Medicine

## 2021-11-08 DIAGNOSIS — J4540 Moderate persistent asthma, uncomplicated: Secondary | ICD-10-CM

## 2021-11-10 MED ORDER — MONTELUKAST SODIUM 10 MG PO TABS: 10.0000 mg | ORAL_TABLET | Freq: Every day | ORAL | 0 refills | Status: DC

## 2021-11-12 ENCOUNTER — Encounter: Payer: Self-pay | Admitting: Family Medicine

## 2021-11-12 ENCOUNTER — Ambulatory Visit (INDEPENDENT_AMBULATORY_CARE_PROVIDER_SITE_OTHER): Payer: 59 | Admitting: Family Medicine

## 2021-11-12 ENCOUNTER — Other Ambulatory Visit: Payer: Self-pay

## 2021-11-12 VITALS — BP 110/72 | HR 105 | Temp 98.4°F | Ht 66.0 in | Wt 178.1 lb

## 2021-11-12 DIAGNOSIS — I479 Paroxysmal tachycardia, unspecified: Secondary | ICD-10-CM | POA: Diagnosis not present

## 2021-11-12 DIAGNOSIS — Z0000 Encounter for general adult medical examination without abnormal findings: Secondary | ICD-10-CM

## 2021-11-12 DIAGNOSIS — J4520 Mild intermittent asthma, uncomplicated: Secondary | ICD-10-CM

## 2021-11-12 DIAGNOSIS — I10 Essential (primary) hypertension: Secondary | ICD-10-CM

## 2021-11-12 NOTE — Assessment & Plan Note (Signed)
Chronic stable ? ?Heart rate is elevated in the office today.  She has seen a cardiologist with a work-up in the past per her report but this was remotely.  She denies any symptoms at the time of palpitation. ? ?I encouraged her to decrease stress and increase her water intake.  She denies caffeine alcohol and decongestants.  She will return for further evaluation if this continues. ?

## 2021-11-12 NOTE — Assessment & Plan Note (Signed)
Stable, chronic.  Continue current medication.    

## 2021-11-12 NOTE — Patient Instructions (Addendum)
Return  if palpitations  continuing for EKG and further evaluation. ? Decrease  stress, increase water. ? ?Please call the location of your choice from the menu below to schedule your Mammogram and/or Bone Density appointment.   ? ?[place]  ? ?[facility]                ?      Phone:  [phone] ?[address]                               ?[place], [place] [address]                                                             ?Services: Traditional and 3D Mammogram, Bone Density  ? ?[facility]           ?      Phone: [phone] ?[address]                                                       ?[place], [place] [address]    ?Service: Bone Density ONLY  ? *this site does NOT perform mammograms ? ?[facility]                       ? Phone:  [phone] ?[address]                                  ?[place], [place] [address]                                            ?Services:  3D Mammogram and Bone Density  ? ? ?

## 2021-11-12 NOTE — Progress Notes (Signed)
? ? Patient ID: Mackenzie Shea, female    DOB: 1981/08/09, 41 y.o.   MRN: [id number] ? ?This visit was conducted in person. ? ?BP 110/72 (BP Location: Left Arm, Patient Position: Sitting, Cuff Size: Large)   Pulse (!) 105   Temp 98.4 ?F (36.9 ?C) (Oral)   Ht '5\' 6"'$  (1.676 m)   Wt 178 lb 1.6 oz (80.8 kg)   LMP 04/30/2017 (Exact Date)   SpO2 96%   BMI 28.75 kg/m?   ? ?CC:  ?Chief Complaint  ?Patient presents with  ? Annual Exam  ? ? ?Subjective:  ? ?HPI: ?Mackenzie Shea is a 41 y.o. female presenting on 11/12/2021 for Annual Exam ? ?Hypertension:    ?BP Readings from Last 3 Encounters:  ?11/12/21 110/72  ?08/09/21 104/70  ?10/03/20 130/78  ? ?Using medication without problems or lightheadedness:  none ?Chest pain with exertion: none ?Edema:none ?Short of breath: none ?Average home BPs: ?Other issues: ? ? Mild intermittent asthma: controlled with  zyrtec, nasal steroid,singulair.  ? ?GAD: stable control on celexa 40 mg daily ? Using lorazepam 0.5 mg BID, using minimally. ? PHq2:0 GAD7:0 ?  ?Migraine/tension headaches: rarely using imitrex. ?   ? ? Reviewed labs in detail with pt.  ?Lab Results  ?Component Value Date  ? CHOL 166 11/05/2021  ? HDL 58.70 11/05/2021  ? [place] 93 11/05/2021  ? TRIG 71.0 11/05/2021  ? CHOLHDL 3 11/05/2021  ? ?The 10-year ASCVD risk score (Arnett DK, et al., 2019) is: 0.2% ?  Values used to calculate the score: ?    Age: 27 years ?    Sex: Female ?    Is Non-Hispanic African American: Yes ?    Diabetic: No ?    Tobacco smoker: No ?    Systolic Blood Pressure: 409 mmHg ?    Is BP treated: No ?    HDL Cholesterol: 58.7 mg/dL ?    Total Cholesterol: 166 mg/dL ? ? ?Relevant past medical, surgical, family and social history reviewed and updated as indicated. Interim medical history since our last visit reviewed. ?Allergies and medications reviewed and updated. ?Outpatient Medications Prior to Visit  ?Medication Sig Dispense Refill  ? cetirizine (ZYRTEC) 10 MG tablet Take 10 mg by mouth at  bedtime as needed for allergies.     ? citalopram (CELEXA) 40 MG tablet Take 1 tablet (40 mg total) by mouth daily. 90 tablet 0  ? levalbuterol (XOPENEX HFA) 45 MCG/ACT inhaler INHALE ONE TO TWO PUFFS INTO LUNGS EVERY 4 HOURS AS NEEDED 15 g 0  ? levalbuterol (XOPENEX) 0.63 MG/3ML nebulizer solution Take 3 mLs (0.63 mg total) by nebulization every 4 (four) hours as needed for wheezing or shortness of breath. 3 mL 2  ? LORazepam (ATIVAN) 0.5 MG tablet Take 1 tablet (0.5 mg total) by mouth 2 (two) times daily. 60 tablet 5  ? montelukast (SINGULAIR) 10 MG tablet Take 1 tablet (10 mg total) by mouth at bedtime. 90 tablet 0  ? Multiple Vitamin (MULTIVITAMIN WITH MINERALS) TABS tablet Take 1 tablet by mouth daily.    ? SUMAtriptan (IMITREX) 25 MG tablet Take 2 tablets (50 mg total) by mouth every 2 (two) hours as needed for migraine. 10 tablet 2  ? ?No facility-administered medications prior to visit.  ?  ? ?Per HPI unless specifically indicated in ROS section below ?Review of Systems  ?Cardiovascular:  Positive for palpitations.  ?Objective:  ?BP 110/72 (BP Location: Left Arm, Patient Position: Sitting, Cuff Size: Large)  Pulse (!) 105   Temp 98.4 ?F (36.9 ?C) (Oral)   Ht '5\' 6"'$  (1.676 m)   Wt 178 lb 1.6 oz (80.8 kg)   LMP 04/30/2017 (Exact Date)   SpO2 96%   BMI 28.75 kg/m?   ?Wt Readings from Last 3 Encounters:  ?11/12/21 178 lb 1.6 oz (80.8 kg)  ?08/09/21 177 lb 8 oz (80.5 kg)  ?10/03/20 181 lb 9.6 oz (82.4 kg)  ?  ?  ?Physical Exam ?Constitutional:   ?   General: She is not in acute distress. ?   Appearance: Normal appearance. She is well-developed. She is not ill-appearing or toxic-appearing.  ?HENT:  ?   Head: Normocephalic.  ?   Right Ear: Hearing, tympanic membrane, ear canal and external ear normal. Tympanic membrane is not erythematous, retracted or bulging.  ?   Left Ear: Hearing, tympanic membrane, ear canal and external ear normal. Tympanic membrane is not erythematous, retracted or bulging.  ?   Nose:  No mucosal edema or rhinorrhea.  ?   Right Sinus: No maxillary sinus tenderness or frontal sinus tenderness.  ?   Left Sinus: No maxillary sinus tenderness or frontal sinus tenderness.  ?   Mouth/Throat:  ?   Pharynx: Uvula midline.  ?Eyes:  ?   General: Lids are normal. Lids are everted, no foreign bodies appreciated.  ?   Conjunctiva/sclera: Conjunctivae normal.  ?   Pupils: Pupils are equal, round, and reactive to light.  ?Neck:  ?   Thyroid: No thyroid mass or thyromegaly.  ?   Vascular: No carotid bruit.  ?   Trachea: Trachea normal.  ?Cardiovascular:  ?   Rate and Rhythm: Normal rate and regular rhythm.  ?   Pulses: Normal pulses.  ?   Heart sounds: Normal heart sounds, S1 normal and S2 normal. No murmur heard. ?  No friction rub. No gallop.  ?Pulmonary:  ?   Effort: Pulmonary effort is normal. No tachypnea or respiratory distress.  ?   Breath sounds: Normal breath sounds. No decreased breath sounds, wheezing, rhonchi or rales.  ?Abdominal:  ?   General: Bowel sounds are normal.  ?   Palpations: Abdomen is soft.  ?   Tenderness: There is no abdominal tenderness.  ?Musculoskeletal:  ?   Cervical back: Normal range of motion and neck supple.  ?Skin: ?   General: Skin is warm and dry.  ?   Findings: No rash.  ?Neurological:  ?   Mental Status: She is alert.  ?Psychiatric:     ?   Mood and Affect: Mood is not anxious or depressed.     ?   Speech: Speech normal.     ?   Behavior: Behavior normal. Behavior is cooperative.     ?   Thought Content: Thought content normal.     ?   Judgment: Judgment normal.  ? ?   ?Results for orders placed or performed in visit on 11/05/21  ?VITAMIN D 25 Hydroxy (Vit-D Deficiency, Fractures)  ?Result Value Ref Range  ? VITD 56.61 30.00 - 100.00 ng/mL  ?Comprehensive metabolic panel  ?Result Value Ref Range  ? Sodium 135 135 - 145 mEq/L  ? Potassium 4.1 3.5 - 5.1 mEq/L  ? Chloride 103 96 - 112 mEq/L  ? CO2 26 19 - 32 mEq/L  ? Glucose, Bld 79 70 - 99 mg/dL  ? BUN 16 6 - 23 mg/dL  ?  Creatinine, Ser 0.84 0.40 - 1.20 mg/dL  ? Total Bilirubin  0.4 0.2 - 1.2 mg/dL  ? Alkaline Phosphatase 46 39 - 117 U/L  ? AST 18 0 - 37 U/L  ? ALT 15 0 - 35 U/L  ? Total Protein 6.9 6.0 - 8.3 g/dL  ? Albumin 4.1 3.5 - 5.2 g/dL  ? GFR 86.04 >60.00 mL/min  ? Calcium 9.0 8.4 - 10.5 mg/dL  ?Lipid panel  ?Result Value Ref Range  ? Cholesterol 166 0 - 200 mg/dL  ? Triglycerides 71.0 0.0 - 149.0 mg/dL  ? HDL 58.70 >39.00 mg/dL  ? VLDL 14.2 0.0 - 40.0 mg/dL  ? LDL Cholesterol 93 0 - 99 mg/dL  ? Total CHOL/HDL Ratio 3   ? NonHDL 107.31   ? ? ?This visit occurred during the SARS-CoV-2 public health emergency.  Safety protocols were in place, including screening questions prior to the visit, additional usage of staff PPE, and extensive cleaning of exam room while observing appropriate contact time as indicated for disinfecting solutions.  ? ?COVID 19 screen:  No recent travel or known exposure to [place] ?The patient denies respiratory symptoms of COVID 19 at this time. ?The importance of social distancing was discussed today.  ? ?Assessment and Plan ? ? The patient's preventative maintenance and recommended screening tests for an annual wellness exam were reviewed in full today. ?Brought up to date unless services declined. ? ?Counselled on the importance of diet, exercise, and its role in overall health and mortality. ?The patient's FH and SH was reviewed, including their home life, tobacco status, and drug and alcohol status.  ? ?Vaccines: Tdap up to date, COVID x3 ?Pap/DVE:  not indicated, s/p hysterectomy ?Mammo:  due ?Colon:  no early family history.Marland Kitchen PGF  age 92s ?Smoking Status: none ?ETOH/ drug use: none/none ? Hep C:  due ? HIV screen:   refused ? ?Problem List Items Addressed This Visit   ? ? Essential hypertension  ?  Well controlled on no meds. ?  ?  ? Mild intermittent asthma  ?  Stable, chronic.  Continue current medication. ? ? ? ?  ?  ? Paroxysmal tachycardia ([place])  ?  Chronic stable ? ?Heart rate is elevated  in the office today.  She has seen a cardiologist with a work-up in the past per her report but this was remotely.  She denies any symptoms at the time of palpitation. ? ?I encouraged her to decrease stress and increase

## 2021-11-12 NOTE — Assessment & Plan Note (Signed)
Well controlled on no meds. ?

## 2021-12-19 ENCOUNTER — Other Ambulatory Visit: Payer: Self-pay | Admitting: Family Medicine

## 2021-12-19 DIAGNOSIS — J4520 Mild intermittent asthma, uncomplicated: Secondary | ICD-10-CM

## 2021-12-19 DIAGNOSIS — F411 Generalized anxiety disorder: Secondary | ICD-10-CM

## 2021-12-19 MED ORDER — LEVALBUTEROL TARTRATE 45 MCG/ACT IN AERO: INHALATION_SPRAY | RESPIRATORY_TRACT | 5 refills | Status: DC

## 2021-12-19 MED ORDER — CITALOPRAM HYDROBROMIDE 40 MG PO TABS: 40.0000 mg | ORAL_TABLET | Freq: Every day | ORAL | 1 refills | Status: DC

## 2022-02-19 ENCOUNTER — Other Ambulatory Visit: Payer: Self-pay | Admitting: Family Medicine

## 2022-02-19 DIAGNOSIS — J4540 Moderate persistent asthma, uncomplicated: Secondary | ICD-10-CM

## 2022-02-20 MED ORDER — MONTELUKAST SODIUM 10 MG PO TABS: 10.0000 mg | ORAL_TABLET | Freq: Every day | ORAL | 3 refills | Status: DC

## 2022-07-14 ENCOUNTER — Encounter: Payer: Self-pay | Admitting: Family Medicine

## 2022-07-14 DIAGNOSIS — F411 Generalized anxiety disorder: Secondary | ICD-10-CM

## 2022-07-14 DIAGNOSIS — Z1231 Encounter for screening mammogram for malignant neoplasm of breast: Secondary | ICD-10-CM

## 2022-07-14 MED ORDER — CITALOPRAM HYDROBROMIDE 40 MG PO TABS: 40.0000 mg | ORAL_TABLET | Freq: Every day | ORAL | 1 refills | Status: DC

## 2022-07-14 NOTE — Addendum Note (Signed)
Addended by: Eliezer Lofts E on: 07/14/2022 12:41 PM   Modules accepted: Orders

## 2022-07-14 NOTE — Telephone Encounter (Signed)
Need orders thank you

## 2022-07-31 ENCOUNTER — Other Ambulatory Visit: Payer: Self-pay | Admitting: Family Medicine

## 2022-07-31 DIAGNOSIS — Z1231 Encounter for screening mammogram for malignant neoplasm of breast: Secondary | ICD-10-CM

## 2022-08-11 ENCOUNTER — Ambulatory Visit
Admission: RE | Admit: 2022-08-11 | Discharge: 2022-08-11 | Disposition: A | Discharge status: Home / Self Care | Payer: Self-pay | Source: Ambulatory Visit | Attending: Family Medicine | Admitting: Family Medicine

## 2022-08-11 DIAGNOSIS — Z1231 Encounter for screening mammogram for malignant neoplasm of breast: Secondary | ICD-10-CM | POA: Insufficient documentation

## 2022-08-13 ENCOUNTER — Other Ambulatory Visit: Payer: Self-pay | Admitting: Family Medicine

## 2022-08-13 DIAGNOSIS — R928 Other abnormal and inconclusive findings on diagnostic imaging of breast: Secondary | ICD-10-CM

## 2022-08-13 DIAGNOSIS — R921 Mammographic calcification found on diagnostic imaging of breast: Secondary | ICD-10-CM

## 2022-08-18 ENCOUNTER — Encounter: Payer: Self-pay | Admitting: Family Medicine

## 2022-08-18 DIAGNOSIS — J4540 Moderate persistent asthma, uncomplicated: Secondary | ICD-10-CM

## 2022-08-18 MED ORDER — MONTELUKAST SODIUM 10 MG PO TABS: 10.0000 mg | ORAL_TABLET | Freq: Every day | ORAL | 0 refills | Status: DC

## 2022-08-22 ENCOUNTER — Ambulatory Visit
Admission: RE | Admit: 2022-08-22 | Discharge: 2022-08-22 | Disposition: A | Discharge status: Home / Self Care | Payer: 59 | Source: Ambulatory Visit | Attending: Family Medicine | Admitting: Family Medicine

## 2022-08-22 DIAGNOSIS — R921 Mammographic calcification found on diagnostic imaging of breast: Secondary | ICD-10-CM | POA: Diagnosis present

## 2022-08-22 DIAGNOSIS — R928 Other abnormal and inconclusive findings on diagnostic imaging of breast: Secondary | ICD-10-CM | POA: Diagnosis present

## 2022-08-26 ENCOUNTER — Other Ambulatory Visit: Payer: Self-pay | Admitting: Family Medicine

## 2022-08-26 DIAGNOSIS — R921 Mammographic calcification found on diagnostic imaging of breast: Secondary | ICD-10-CM

## 2022-08-26 DIAGNOSIS — R928 Other abnormal and inconclusive findings on diagnostic imaging of breast: Secondary | ICD-10-CM

## 2022-08-28 ENCOUNTER — Encounter: Payer: Self-pay | Admitting: Family Medicine

## 2022-08-28 DIAGNOSIS — L989 Disorder of the skin and subcutaneous tissue, unspecified: Secondary | ICD-10-CM

## 2022-09-16 ENCOUNTER — Ambulatory Visit
Admission: RE | Admit: 2022-09-16 | Discharge: 2022-09-16 | Disposition: A | Discharge status: Home / Self Care | Payer: BC Managed Care – PPO | Source: Ambulatory Visit | Attending: Family Medicine | Admitting: Family Medicine

## 2022-09-16 DIAGNOSIS — R921 Mammographic calcification found on diagnostic imaging of breast: Secondary | ICD-10-CM | POA: Diagnosis not present

## 2022-09-16 DIAGNOSIS — R928 Other abnormal and inconclusive findings on diagnostic imaging of breast: Secondary | ICD-10-CM | POA: Diagnosis not present

## 2022-09-16 HISTORY — PX: BREAST BIOPSY: SHX20

## 2022-09-16 MED ORDER — LIDOCAINE HCL (PF) 1 % IJ SOLN
5.0000 mL | Freq: Once | INTRAMUSCULAR | Status: AC
  Administered 2022-09-16: 5 mL
  Filled 2022-09-16: qty 5

## 2022-09-16 MED ORDER — LIDOCAINE-EPINEPHRINE (PF) 1 %-1:200000 IJ SOLN
20.0000 mL | Freq: Once | INTRAMUSCULAR | Status: AC
  Administered 2022-09-16: 20 mL
  Filled 2022-09-16: qty 20

## 2022-09-18 LAB — SURGICAL PATHOLOGY

## 2022-10-08 ENCOUNTER — Ambulatory Visit: Payer: 59 | Admitting: Dermatology

## 2022-10-31 ENCOUNTER — Telehealth: Payer: Self-pay | Admitting: *Deleted

## 2022-10-31 DIAGNOSIS — Z1159 Encounter for screening for other viral diseases: Secondary | ICD-10-CM

## 2022-10-31 DIAGNOSIS — I10 Essential (primary) hypertension: Secondary | ICD-10-CM

## 2022-10-31 DIAGNOSIS — E559 Vitamin D deficiency, unspecified: Secondary | ICD-10-CM

## 2022-10-31 NOTE — Telephone Encounter (Signed)
-----   Message from Velna Hatchet, RT sent at 10/22/2022 10:07 AM EST ----- Regarding: Fri 3/8 lab Patient is scheduled for cpx, please order future labs.  Thanks, Anda Kraft

## 2022-10-31 NOTE — Addendum Note (Signed)
Addended by: Eliezer Lofts E on: 10/31/2022 05:32 PM   Modules accepted: Orders

## 2022-11-07 ENCOUNTER — Other Ambulatory Visit (INDEPENDENT_AMBULATORY_CARE_PROVIDER_SITE_OTHER): Payer: Federal, State, Local not specified - PPO

## 2022-11-07 DIAGNOSIS — E559 Vitamin D deficiency, unspecified: Secondary | ICD-10-CM

## 2022-11-07 DIAGNOSIS — Z1159 Encounter for screening for other viral diseases: Secondary | ICD-10-CM

## 2022-11-07 DIAGNOSIS — I10 Essential (primary) hypertension: Secondary | ICD-10-CM

## 2022-11-07 LAB — COMPREHENSIVE METABOLIC PANEL
ALT: 17 U/L (ref 0–35)
AST: 20 U/L (ref 0–37)
Albumin: 3.5 g/dL (ref 3.5–5.2)
Alkaline Phosphatase: 42 U/L (ref 39–117)
BUN: 10 mg/dL (ref 6–23)
CO2: 30 mEq/L (ref 19–32)
Calcium: 9 mg/dL (ref 8.4–10.5)
Chloride: 104 mEq/L (ref 96–112)
Creatinine, Ser: 0.95 mg/dL (ref 0.40–1.20)
GFR: 73.71 mL/min (ref >60.00)
Glucose, Bld: 87 mg/dL (ref 70–99)
Potassium: 4.5 mEq/L (ref 3.5–5.1)
Sodium: 139 mEq/L (ref 135–145)
Total Bilirubin: 0.3 mg/dL (ref 0.2–1.2)
Total Protein: 6.4 g/dL (ref 6.0–8.3)

## 2022-11-07 LAB — LIPID PANEL
Cholesterol: 130 mg/dL (ref 0–200)
HDL: 38.5 mg/dL — ABNORMAL LOW (ref >39.00)
LDL Cholesterol: 66 mg/dL (ref 0–99)
NonHDL: 91.96
Total CHOL/HDL Ratio: 3
Triglycerides: 132 mg/dL (ref 0.0–149.0)
VLDL: 26.4 mg/dL (ref 0.0–40.0)

## 2022-11-07 LAB — VITAMIN D 25 HYDROXY (VIT D DEFICIENCY, FRACTURES): VITD: 41.74 ng/mL (ref 30.00–100.00)

## 2022-11-07 NOTE — Progress Notes (Signed)
No critical labs need to be addressed urgently. We will discuss labs in detail at upcoming office visit.   

## 2022-11-09 LAB — HEPATITIS C ANTIBODY: Hepatitis C Ab: NONREACTIVE

## 2022-11-11 NOTE — Progress Notes (Signed)
No critical labs need to be addressed urgently. We will discuss labs in detail at upcoming office visit.   

## 2022-11-14 ENCOUNTER — Encounter: Payer: Federal, State, Local not specified - PPO | Admitting: Family Medicine

## 2022-11-18 ENCOUNTER — Encounter: Payer: Self-pay | Admitting: Family Medicine

## 2022-11-18 ENCOUNTER — Ambulatory Visit (INDEPENDENT_AMBULATORY_CARE_PROVIDER_SITE_OTHER): Payer: Federal, State, Local not specified - PPO | Admitting: Family Medicine

## 2022-11-18 VITALS — BP 122/75 | HR 104 | Temp 98.2°F | Ht 66.5 in | Wt 186.0 lb

## 2022-11-18 DIAGNOSIS — I10 Essential (primary) hypertension: Secondary | ICD-10-CM

## 2022-11-18 DIAGNOSIS — Z0000 Encounter for general adult medical examination without abnormal findings: Secondary | ICD-10-CM

## 2022-11-18 DIAGNOSIS — I479 Paroxysmal tachycardia, unspecified: Secondary | ICD-10-CM | POA: Diagnosis not present

## 2022-11-18 DIAGNOSIS — J4520 Mild intermittent asthma, uncomplicated: Secondary | ICD-10-CM

## 2022-11-18 DIAGNOSIS — F411 Generalized anxiety disorder: Secondary | ICD-10-CM

## 2022-11-18 NOTE — Progress Notes (Signed)
Patient ID: Mackenzie Shea, female    DOB: [birth year], 42 y.o.   MRN: [id number]  This visit was conducted in person.  BP 122/75   Pulse (!) 104   Temp 98.2 F (36.8 C) (Temporal)   Ht 5' 6.5" (1.689 m)   Wt 186 lb (84.4 kg)   LMP 04/30/2017 (Exact Date)   SpO2 97%   BMI 29.57 kg/m    CC:  Chief Complaint  Patient presents with   Annual Exam    Subjective:   HPI: Mackenzie Shea is a 42 y.o. female presenting on 11/18/2022 for Annual Exam  Hypertension:  Well-controlled on no medication BP Readings from Last 3 Encounters:  11/18/22 122/75  11/12/21 110/72  08/09/21 104/70   Using medication without problems or lightheadedness:  none Chest pain with exertion: none Edema:none Short of breath: none Average home BPs: Other issues:   Mild intermittent asthma: controlled with  zyrtec, nasal steroid,singulair.   GAD: stable control on celexa 40 mg daily  Using lorazepam 0.5 mg BID, using minimally.  PHq2:0 GAD7:0   Migraine/tension headaches: rarely using imitrex.    She has been working again on healthy eating.. plan to restart exercise.  Wt Readings from Last 3 Encounters:  11/18/22 186 lb (84.4 kg)  11/12/21 178 lb 1.6 oz (80.8 kg)  08/09/21 177 lb 8 oz (80.5 kg)  Body mass index is 29.57 kg/m.   Reviewed labs in detail with pt.  Lab Results  Component Value Date   CHOL 130 11/07/2022   HDL 38.50 (L) 11/07/2022   LDLCALC 66 11/07/2022   TRIG 132.0 11/07/2022   CHOLHDL 3 11/07/2022   The 10-year ASCVD risk score (Arnett DK, et al., 2019) is: 0.8%   Values used to calculate the score:     Age: 26 years     Sex: Female     Is Non-Hispanic African American: Yes     Diabetic: No     Tobacco smoker: No     Systolic Blood Pressure: 123XX123 mmHg     Is BP treated: No     HDL Cholesterol: 38.5 mg/dL     Total Cholesterol: 130 mg/dL   Relevant past medical, surgical, family and social history reviewed and updated as indicated. Interim medical history  since our last visit reviewed. Allergies and medications reviewed and updated. Outpatient Medications Prior to Visit  Medication Sig Dispense Refill   cetirizine (ZYRTEC) 10 MG tablet Take 10 mg by mouth at bedtime as needed for allergies.      citalopram (CELEXA) 40 MG tablet Take 1 tablet (40 mg total) by mouth daily. 90 tablet 1   levalbuterol (XOPENEX HFA) 45 MCG/ACT inhaler INHALE ONE TO TWO PUFFS INTO LUNGS EVERY 4 HOURS AS NEEDED 15 g 5   levalbuterol (XOPENEX) 0.63 MG/3ML nebulizer solution Take 3 mLs (0.63 mg total) by nebulization every 4 (four) hours as needed for wheezing or shortness of breath. 3 mL 2   LORazepam (ATIVAN) 0.5 MG tablet Take 1 tablet (0.5 mg total) by mouth 2 (two) times daily. 60 tablet 5   montelukast (SINGULAIR) 10 MG tablet Take 1 tablet (10 mg total) by mouth at bedtime. 90 tablet 0   Multiple Vitamin (MULTIVITAMIN WITH MINERALS) TABS tablet Take 1 tablet by mouth daily.     SUMAtriptan (IMITREX) 25 MG tablet Take 2 tablets (50 mg total) by mouth every 2 (two) hours as needed for migraine. 10 tablet 2   No facility-administered medications prior  to visit.     Per HPI unless specifically indicated in ROS section below Review of Systems  Constitutional:  Negative for fatigue and fever.  HENT:  Negative for congestion.   Eyes:  Negative for pain.  Respiratory:  Negative for cough and shortness of breath.   Cardiovascular:  Positive for palpitations. Negative for chest pain and leg swelling.  Gastrointestinal:  Negative for abdominal pain.  Genitourinary:  Negative for dysuria and vaginal bleeding.  Musculoskeletal:  Negative for back pain.  Neurological:  Negative for syncope, light-headedness and headaches.  Psychiatric/Behavioral:  Negative for dysphoric mood.    Objective:  BP 122/75   Pulse (!) 104   Temp 98.2 F (36.8 C) (Temporal)   Ht 5' 6.5" (1.689 m)   Wt 186 lb (84.4 kg)   LMP 04/30/2017 (Exact Date)   SpO2 97%   BMI 29.57 kg/m   Wt  Readings from Last 3 Encounters:  11/18/22 186 lb (84.4 kg)  11/12/21 178 lb 1.6 oz (80.8 kg)  08/09/21 177 lb 8 oz (80.5 kg)      Physical Exam Constitutional:      General: She is not in acute distress.    Appearance: Normal appearance. She is well-developed. She is not ill-appearing or toxic-appearing.  HENT:     Head: Normocephalic.     Right Ear: Hearing, tympanic membrane, ear canal and external ear normal. Tympanic membrane is not erythematous, retracted or bulging.     Left Ear: Hearing, tympanic membrane, ear canal and external ear normal. Tympanic membrane is not erythematous, retracted or bulging.     Nose: No mucosal edema or rhinorrhea.     Right Sinus: No maxillary sinus tenderness or frontal sinus tenderness.     Left Sinus: No maxillary sinus tenderness or frontal sinus tenderness.     Mouth/Throat:     Pharynx: Uvula midline.  Eyes:     General: Lids are normal. Lids are everted, no foreign bodies appreciated.     Conjunctiva/sclera: Conjunctivae normal.     Pupils: Pupils are equal, round, and reactive to light.  Neck:     Thyroid: No thyroid mass or thyromegaly.     Vascular: No carotid bruit.     Trachea: Trachea normal.  Cardiovascular:     Rate and Rhythm: Normal rate and regular rhythm.     Pulses: Normal pulses.     Heart sounds: Normal heart sounds, S1 normal and S2 normal. No murmur heard.    No friction rub. No gallop.  Pulmonary:     Effort: Pulmonary effort is normal. No tachypnea or respiratory distress.     Breath sounds: Normal breath sounds. No decreased breath sounds, wheezing, rhonchi or rales.  Chest:       Comments: Several moles, unchanging per pt on right breast.. has derm appt to eval upcoming Abdominal:     General: Bowel sounds are normal.     Palpations: Abdomen is soft.     Tenderness: There is no abdominal tenderness.  Musculoskeletal:     Cervical back: Normal range of motion and neck supple.  Skin:    General: Skin is warm  and dry.     Findings: No rash.  Neurological:     Mental Status: She is alert.  Psychiatric:        Mood and Affect: Mood is not anxious or depressed.        Speech: Speech normal.        Behavior: Behavior normal. Behavior is  cooperative.        Thought Content: Thought content normal.        Judgment: Judgment normal.       Results for orders placed or performed in visit on 11/07/22  Hepatitis C antibody  Result Value Ref Range   Hepatitis C Ab NON-REACTIVE NON-REACTIVE  Vitamin D, 25-hydroxy  Result Value Ref Range   VITD 41.74 30.00 - 100.00 ng/mL  Lipid panel  Result Value Ref Range   Cholesterol 130 0 - 200 mg/dL   Triglycerides 132.0 0.0 - 149.0 mg/dL   HDL 38.50 (L) >39.00 mg/dL   VLDL 26.4 0.0 - 40.0 mg/dL   LDL Cholesterol 66 0 - 99 mg/dL   Total CHOL/HDL Ratio 3    NonHDL 91.96   Comprehensive metabolic panel  Result Value Ref Range   Sodium 139 135 - 145 mEq/L   Potassium 4.5 3.5 - 5.1 mEq/L   Chloride 104 96 - 112 mEq/L   CO2 30 19 - 32 mEq/L   Glucose, Bld 87 70 - 99 mg/dL   BUN 10 6 - 23 mg/dL   Creatinine, Ser 0.95 0.40 - 1.20 mg/dL   Total Bilirubin 0.3 0.2 - 1.2 mg/dL   Alkaline Phosphatase 42 39 - 117 U/L   AST 20 0 - 37 U/L   ALT 17 0 - 35 U/L   Total Protein 6.4 6.0 - 8.3 g/dL   Albumin 3.5 3.5 - 5.2 g/dL   GFR 73.71 >60.00 mL/min   Calcium 9.0 8.4 - 10.5 mg/dL    This visit occurred during the SARS-CoV-2 public health emergency.  Safety protocols were in place, including screening questions prior to the visit, additional usage of staff PPE, and extensive cleaning of exam room while observing appropriate contact time as indicated for disinfecting solutions.   COVID 19 screen:  No recent travel or known exposure to COVID19 The patient denies respiratory symptoms of COVID 19 at this time. The importance of social distancing was discussed today.   Assessment and Plan   The patient's preventative maintenance and recommended screening tests for  an annual wellness exam were reviewed in full today. Brought up to date unless services declined.  Counselled on the importance of diet, exercise, and its role in overall health and mortality. The patient's FH and SH was reviewed, including their home life, tobacco status, and drug and alcohol status.   Vaccines: Tdap up to date, COVID x3 Pap/DVE:  not indicated, s/p hysterectomy Mammo: 08/2022  abnormal.. nml on right breast biopsy, repeat in 6 months. Colon:  no early family history.Marland Kitchen PGF  age 52s Smoking Status: none ETOH/ drug use: none/none  Hep C:  due  HIV screen:   refused  Problem List Items Addressed This Visit     Essential hypertension    Well controlled on no meds.      GAD (generalized anxiety disorder)    Stable, chronic.  Continue current medication.   Celexa 40 mg daily  Lorazepam rarely      Mild intermittent asthma    Stable, chronic.  Continue current medication.  zyrtec, nasal steroid,singulair.        Paroxysmal tachycardia (HCC)    Chronic stable  Heart rate is elevated in the office today.   She denies any symptoms at the time of palpitation.        Other Visit Diagnoses     Routine general medical examination at a health care facility    -  Primary  Temesgen Weightman, MD   

## 2022-11-18 NOTE — Assessment & Plan Note (Signed)
Stable, chronic.  Continue current medication.   Celexa 40 mg daily  Lorazepam rarely 

## 2022-11-18 NOTE — Assessment & Plan Note (Signed)
Stable, chronic.  Continue current medication.  zyrtec, nasal steroid,singulair.

## 2022-11-18 NOTE — Assessment & Plan Note (Addendum)
Chronic stable  Heart rate is elevated in the office today.   Occasional episodes of palpitation otherwise asymptomatic. Not using a decongestant and limiting caffeine. Consider repeat thyroid function

## 2022-11-18 NOTE — Assessment & Plan Note (Signed)
Well controlled on no meds 

## 2022-12-25 ENCOUNTER — Ambulatory Visit: Payer: 59 | Admitting: Dermatology

## 2023-01-30 ENCOUNTER — Telehealth (INDEPENDENT_AMBULATORY_CARE_PROVIDER_SITE_OTHER): Payer: Federal, State, Local not specified - PPO | Admitting: Family Medicine

## 2023-01-30 ENCOUNTER — Encounter: Payer: Self-pay | Admitting: Family Medicine

## 2023-01-30 VITALS — BP 148/84 | HR 97 | Ht 66.5 in | Wt 186.0 lb

## 2023-01-30 DIAGNOSIS — R591 Generalized enlarged lymph nodes: Secondary | ICD-10-CM

## 2023-01-30 MED ORDER — DOXYCYCLINE HYCLATE 100 MG PO TABS: 100.0000 mg | ORAL_TABLET | Freq: Two times a day (BID) | ORAL | 0 refills | Status: DC

## 2023-01-30 NOTE — Assessment & Plan Note (Signed)
Acute, most likely secondary lymph node swelling associated with skin infection versus lymphadenitis. 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 twice daily x 10 days. Can use Tylenol or ibuprofen as needed for pain, expect significant improvement in 24 to 48 hours.  If symptoms or not improving as expected she will need to be seen in person.  Return and ER precautions provided

## 2023-01-30 NOTE — Progress Notes (Signed)
VIRTUAL VISIT A virtual visit is felt to be most appropriate for this patient at this time.   I connected with the patient on 01/30/23 at  8:20 AM EDT by virtual telehealth platform and verified that I am speaking with the correct person using two identifiers.   I discussed the limitations, risks, security and privacy concerns of performing an evaluation and management service by  virtual telehealth platform and the availability of in person appointments. I also discussed with the patient that there may be a patient responsible charge related to this service. The patient expressed understanding and agreed to proceed.  Patient location: Home Provider Location: [place] Participants: Kerby Nora and Grace Bushy   Chief Complaint  Patient presents with   Sore    In the back in the head. Thinks it may be infected.Tender and she can not even lay on it.    History of Present Illness:  42 y.o. female patient of Arnet Hofferber E, MD presents with  new onset skin lesion on back of her head.   1 week ago noted skin lesion on back on head.. ? Pimple, bug bite.  Had scab on it, scratched scab... has been cleaning with alcohol and  neosporin... seems to be healing.  Now with swelling in lymph nodes near by, sore.   No flu like symptoms, occ nausea with ibuprofen, no fever.   No history of skin infection, no MRSA history.  She is a Engineer, civil (consulting).  COVID 19 screen No recent travel or known exposure to COVID19 The patient denies respiratory symptoms of COVID 19 at this time.  The importance of social distancing was discussed today.   Review of Systems  Constitutional:  Negative for chills and fever.  HENT:  Negative for congestion and ear pain.   Eyes:  Negative for pain and redness.  Respiratory:  Negative for cough and shortness of breath.   Cardiovascular:  Negative for chest pain, palpitations and leg swelling.  Gastrointestinal:  Negative for abdominal pain, blood in stool,  constipation, diarrhea, nausea and vomiting.  Genitourinary:  Negative for dysuria.  Musculoskeletal:  Negative for falls and myalgias.  Skin:  Negative for rash.  Neurological:  Negative for dizziness.  Psychiatric/Behavioral:  Negative for depression. The patient is not nervous/anxious.       Past Medical History:  Diagnosis Date   Allergic rhinitis, cause unspecified    Allergy    Anemia    history of   Anxiety    Elevated blood pressure reading without diagnosis of hypertension    Esophageal reflux    Extrinsic asthma, unspecified    Leukocytosis, unspecified    Pain in joint, shoulder region    Personal history of traumatic fracture    Tachycardia    with anxiety   Unspecified vitamin D deficiency     reports that she has never smoked. She has never used smokeless tobacco. She reports that she does not drink alcohol and does not use drugs.   Current Outpatient Medications:    cetirizine (ZYRTEC) 10 MG tablet, Take 10 mg by mouth at bedtime as needed for allergies. , Disp: , Rfl:    citalopram (CELEXA) 40 MG tablet, Take 1 tablet (40 mg total) by mouth daily., Disp: 90 tablet, Rfl: 1   doxycycline (VIBRA-TABS) 100 MG tablet, Take 1 tablet (100 mg total) by mouth 2 (two) times daily., Disp: 20 tablet, Rfl: 0   levalbuterol (XOPENEX HFA) 45 MCG/ACT inhaler, INHALE ONE TO TWO PUFFS  INTO LUNGS EVERY 4 HOURS AS NEEDED, Disp: 15 g, Rfl: 5   levalbuterol (XOPENEX) 0.63 MG/3ML nebulizer solution, Take 3 mLs (0.63 mg total) by nebulization every 4 (four) hours as needed for wheezing or shortness of breath., Disp: 3 mL, Rfl: 2   LORazepam (ATIVAN) 0.5 MG tablet, Take 1 tablet (0.5 mg total) by mouth 2 (two) times daily., Disp: 60 tablet, Rfl: 5   montelukast (SINGULAIR) 10 MG tablet, Take 1 tablet (10 mg total) by mouth at bedtime., Disp: 90 tablet, Rfl: 0   Multiple Vitamin (MULTIVITAMIN WITH MINERALS) TABS tablet, Take 1 tablet by mouth daily., Disp: , Rfl:    SUMAtriptan (IMITREX) 25  MG tablet, Take 2 tablets (50 mg total) by mouth every 2 (two) hours as needed for migraine., Disp: 10 tablet, Rfl: 2   Observations/Objective: Blood pressure (!) 148/84, pulse 97, height 5' 6.5" (1.689 m), weight 186 lb (84.4 kg), last menstrual period 04/30/2017.  Physical Exam Constitutional:      General: The patient is not in acute distress. Pulmonary:     Effort: Pulmonary effort is normal. No respiratory distress.  Neurological:     Mental Status: The patient is alert and oriented to person, place, and time.  Psychiatric:        Mood and Affect: Mood normal.        Behavior: Behavior normal.  Skin: Slight erythema on left upper scalp where healed initial injury/scab was.  No streaking of redness unable to appreciate lymphadenopathy virtually but area of swelling is in the area of occipital lymph nodes.  Assessment and Plan Lymphadenopathy Assessment & Plan: Acute, most likely secondary lymph node swelling associated with skin infection versus lymphadenitis. 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 twice daily x 10 days. Can use Tylenol or ibuprofen as needed for pain, expect significant improvement in 24 to 48 hours.  If symptoms or not improving as expected she will need to be seen in person.  Return and ER precautions provided   Other orders -     Doxycycline Hyclate; Take 1 tablet (100 mg total) by mouth 2 (two) times daily.  Dispense: 20 tablet; Refill: 0      I discussed the assessment and treatment plan with the patient. The patient was provided an opportunity to ask questions and all were answered. The patient agreed with the plan and demonstrated an understanding of the instructions.   The patient was advised to call back or seek an in-person evaluation if the symptoms worsen or if the condition fails to improve as anticipated.     Kerby Nora, MD

## 2023-02-12 ENCOUNTER — Other Ambulatory Visit: Payer: Self-pay | Admitting: Family Medicine

## 2023-02-12 ENCOUNTER — Encounter: Payer: Self-pay | Admitting: Family Medicine

## 2023-02-12 DIAGNOSIS — F411 Generalized anxiety disorder: Secondary | ICD-10-CM

## 2023-02-13 MED ORDER — CITALOPRAM HYDROBROMIDE 40 MG PO TABS: 40.0000 mg | ORAL_TABLET | Freq: Every day | ORAL | 1 refills | Status: DC

## 2023-02-26 ENCOUNTER — Ambulatory Visit: Payer: Federal, State, Local not specified - PPO | Admitting: Dermatology

## 2023-05-12 ENCOUNTER — Other Ambulatory Visit: Payer: Self-pay | Admitting: Family Medicine

## 2023-05-12 DIAGNOSIS — J4540 Moderate persistent asthma, uncomplicated: Secondary | ICD-10-CM

## 2023-08-08 ENCOUNTER — Other Ambulatory Visit: Payer: Self-pay | Admitting: Family Medicine

## 2023-08-08 DIAGNOSIS — F411 Generalized anxiety disorder: Secondary | ICD-10-CM

## 2023-12-01 ENCOUNTER — Other Ambulatory Visit: Payer: Self-pay | Admitting: Family Medicine

## 2023-12-01 ENCOUNTER — Telehealth: Payer: Self-pay | Admitting: *Deleted

## 2023-12-01 DIAGNOSIS — J4540 Moderate persistent asthma, uncomplicated: Secondary | ICD-10-CM

## 2023-12-01 DIAGNOSIS — F411 Generalized anxiety disorder: Secondary | ICD-10-CM

## 2023-12-01 DIAGNOSIS — E559 Vitamin D deficiency, unspecified: Secondary | ICD-10-CM

## 2023-12-01 DIAGNOSIS — I10 Essential (primary) hypertension: Secondary | ICD-10-CM

## 2023-12-01 NOTE — Telephone Encounter (Signed)
 Please schedule CPE with fasting labs prior for Dr. Ermalene Searing.

## 2023-12-01 NOTE — Telephone Encounter (Signed)
-----   Message from Vincenza Hews sent at 12/01/2023  2:04 PM EDT ----- Regarding: Lab Thurs 12/10/23 Hello,  Patient is coming in for CPE labs on Thursday 12/10/23. Can we get orders please.   Thanks

## 2023-12-01 NOTE — Telephone Encounter (Signed)
 Patient scheduled.

## 2023-12-10 ENCOUNTER — Encounter: Payer: Self-pay | Admitting: Family Medicine

## 2023-12-10 ENCOUNTER — Other Ambulatory Visit (INDEPENDENT_AMBULATORY_CARE_PROVIDER_SITE_OTHER)

## 2023-12-10 DIAGNOSIS — I10 Essential (primary) hypertension: Secondary | ICD-10-CM | POA: Diagnosis not present

## 2023-12-10 DIAGNOSIS — E559 Vitamin D deficiency, unspecified: Secondary | ICD-10-CM

## 2023-12-10 LAB — COMPREHENSIVE METABOLIC PANEL WITH GFR
ALT: 20 U/L (ref 0–35)
AST: 21 U/L (ref 0–37)
Albumin: 4.1 g/dL (ref 3.5–5.2)
Alkaline Phosphatase: 50 U/L (ref 39–117)
BUN: 10 mg/dL (ref 6–23)
CO2: 29 meq/L (ref 19–32)
Calcium: 9.2 mg/dL (ref 8.4–10.5)
Chloride: 103 meq/L (ref 96–112)
Creatinine, Ser: 0.97 mg/dL (ref 0.40–1.20)
GFR: 71.34 mL/min (ref >60.00)
Glucose, Bld: 92 mg/dL (ref 70–99)
Potassium: 4.6 meq/L (ref 3.5–5.1)
Sodium: 138 meq/L (ref 135–145)
Total Bilirubin: 0.4 mg/dL (ref 0.2–1.2)
Total Protein: 6.9 g/dL (ref 6.0–8.3)

## 2023-12-10 LAB — LIPID PANEL
Cholesterol: 155 mg/dL (ref 0–200)
HDL: 50.9 mg/dL (ref >39.00)
LDL Cholesterol: 89 mg/dL (ref 0–99)
NonHDL: 103.69
Total CHOL/HDL Ratio: 3
Triglycerides: 73 mg/dL (ref 0.0–149.0)
VLDL: 14.6 mg/dL (ref 0.0–40.0)

## 2023-12-10 LAB — VITAMIN D 25 HYDROXY (VIT D DEFICIENCY, FRACTURES): VITD: 46.38 ng/mL (ref 30.00–100.00)

## 2023-12-10 NOTE — Progress Notes (Signed)
 No critical labs need to be addressed urgently. We will discuss labs in detail at upcoming office visit.

## 2023-12-15 ENCOUNTER — Encounter: Admitting: Family Medicine

## 2023-12-21 ENCOUNTER — Ambulatory Visit (INDEPENDENT_AMBULATORY_CARE_PROVIDER_SITE_OTHER): Admitting: Nurse Practitioner

## 2023-12-21 VITALS — BP 112/82 | HR 93 | Temp 98.1°F | Ht 66.0 in | Wt 195.0 lb

## 2023-12-21 DIAGNOSIS — B029 Zoster without complications: Secondary | ICD-10-CM | POA: Insufficient documentation

## 2023-12-21 MED ORDER — VALACYCLOVIR HCL 1 G PO TABS: 1000.0000 mg | ORAL_TABLET | Freq: Three times a day (TID) | ORAL | 0 refills | Status: DC

## 2023-12-21 NOTE — Progress Notes (Signed)
 Acute Office Visit  Subjective:     Patient ID: Mackenzie Shea, female    DOB: 05-Oct-1980, 43 y.o.   MRN: [id number]  Chief Complaint  Patient presents with   Possible Shingles    Was having pain under left axillary area last week. Rash appeared about 3 days ago. Having burning and itching. Some lesions on left breast.     HPI Patient is in today for rash with a history of htn, migrarine, GAD, ashtma, and tachycardia   States that she first noticed sympotms approx 1 week ago. She started out with sharp pains with deep breathes or sneezes. States that she felt that it was deep and felt nerve related. She felt it go from her left breast to her left upper back. States that on firday the rash started. States that it is on the left breast, undre the arm, and on the back of the arm States that ovet the weekend it will itch and burn. States that intermittent and shocking pain  States that she has not tried anything outside of tylenol   Review of Systems  Constitutional:  Negative for chills and fever.  Respiratory:  Negative for shortness of breath.   Cardiovascular:  Negative for chest pain.  Skin:  Positive for itching and rash.        Objective:    BP 112/82 (BP Location: Left Arm, Patient Position: Sitting, Cuff Size: Large)   Pulse 93   Temp 98.1 F (36.7 C) (Oral)   Ht 5\' 6"  (1.676 m)   Wt 195 lb (88.5 kg)   LMP 04/30/2017 (Exact Date)   BMI 31.47 kg/m    Physical Exam Vitals and nursing note reviewed. Exam conducted with a chaperone present Eulas Hick, CMA).  Constitutional:      Appearance: Normal appearance.  Cardiovascular:     Rate and Rhythm: Normal rate and regular rhythm.     Heart sounds: Normal heart sounds.  Pulmonary:     Effort: Pulmonary effort is normal.     Breath sounds: Normal breath sounds.  Chest:    Skin:         Comments: Groups of coalescing papules  Neurological:     Mental Status: She is alert.     No results found for  any visits on 12/21/23.      Assessment & Plan:   Problem List Items Addressed This Visit       Nervous and Auditory   Herpes zoster without complication - Primary   Will do valacyclovir  1 g 3 times daily for 7 days.  Patient continue using over-the-counter Tylenol  as needed can use over-the-counter lidocaine  4%.  Encouraged her to take her second-generation histamine to help with some of the itching.  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  Did instruct patient that technically airborne until crusted over and they are getting ready to open up.  She will check with her manager if she needs a work note we will gladly provide one      Relevant Medications   valACYclovir  (VALTREX ) 1000 MG tablet    Meds ordered this encounter  Medications   valACYclovir  (VALTREX ) 1000 MG tablet    Sig: Take 1 tablet (1,000 mg total) by mouth 3 (three) times daily.    Dispense:  21 tablet    Refill:  0    Supervising Provider:   Deri Fleet A [[id number]    Return if symptoms worsen or fail  to improve.  Margarie Shay, NP

## 2023-12-21 NOTE — Patient Instructions (Signed)
 Nice to see you today Start taking your zyrtec Get otc lidocaine  4% product Follow up if no improvement or as needed

## 2023-12-21 NOTE — Assessment & Plan Note (Signed)
 Will do valacyclovir  1 g 3 times daily for 7 days.  Patient continue using over-the-counter Tylenol  as needed can use over-the-counter lidocaine  4%.  Encouraged her to take her second-generation histamine to help with some of the itching.  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  Did instruct patient that technically airborne until crusted over and they are getting ready to open up.  She will check with her manager if she needs a work note we will gladly provide one

## 2024-01-07 ENCOUNTER — Encounter: Payer: Self-pay | Admitting: Family Medicine

## 2024-01-07 ENCOUNTER — Ambulatory Visit: Admitting: Family Medicine

## 2024-01-07 VITALS — BP 110/68 | HR 104 | Temp 98.0°F | Ht 66.0 in | Wt 198.4 lb

## 2024-01-07 DIAGNOSIS — R928 Other abnormal and inconclusive findings on diagnostic imaging of breast: Secondary | ICD-10-CM

## 2024-01-07 DIAGNOSIS — J4520 Mild intermittent asthma, uncomplicated: Secondary | ICD-10-CM | POA: Diagnosis not present

## 2024-01-07 DIAGNOSIS — F411 Generalized anxiety disorder: Secondary | ICD-10-CM | POA: Diagnosis not present

## 2024-01-07 DIAGNOSIS — Z0000 Encounter for general adult medical examination without abnormal findings: Secondary | ICD-10-CM | POA: Diagnosis not present

## 2024-01-07 DIAGNOSIS — Z23 Encounter for immunization: Secondary | ICD-10-CM | POA: Diagnosis not present

## 2024-01-07 DIAGNOSIS — I479 Paroxysmal tachycardia, unspecified: Secondary | ICD-10-CM

## 2024-01-07 DIAGNOSIS — I10 Essential (primary) hypertension: Secondary | ICD-10-CM

## 2024-01-07 NOTE — Assessment & Plan Note (Signed)
Stable, chronic.  Continue current medication.  zyrtec, nasal steroid,singulair.

## 2024-01-07 NOTE — Assessment & Plan Note (Signed)
 Chronic stable  Heart rate is elevated in the office today.   Occasional episodes of palpitation otherwise asymptomatic. Not using a decongestant and limiting caffeine. Consider repeat thyroid  function or ZIO monitor in future.

## 2024-01-07 NOTE — Assessment & Plan Note (Signed)
Stable, chronic.  Continue current medication.   Celexa 40 mg daily  Lorazepam rarely 

## 2024-01-07 NOTE — Progress Notes (Signed)
 Patient ID: Mackenzie Shea, female    DOB: Jul 13, 1981, 43 y.o.   MRN: [id number]  This visit was conducted in person.  BP 110/68   Pulse (!) 104   Temp 98 F (36.7 C) (Temporal)   Ht 5\' 6"  (1.676 m)   Wt 198 lb 6 oz (90 kg)   LMP 04/30/2017 (Exact Date)   SpO2 98%   BMI 32.02 kg/m    CC:  Chief Complaint  Patient presents with   Annual Exam    Subjective:   HPI: Mackenzie Shea is a 43 y.o. female presenting on 01/07/2024 for Annual Exam  Hypertension:  Well-controlled on no medication BP Readings from Last 3 Encounters:  01/07/24 110/68  12/21/23 112/82  01/30/23 (!) 148/84  Using medication without problems or lightheadedness:  none Chest pain with exertion: none Edema:none Short of breath: none Average home BPs: Other issues: Wt Readings from Last 3 Encounters:  01/07/24 198 lb 6 oz (90 kg)  12/21/23 195 lb (88.5 kg)  01/30/23 186 lb (84.4 kg)      Mild intermittent asthma: controlled with  zyrtec, nasal steroid,singulair .   GAD: stable control on celexa  40 mg daily  Using lorazepam  0.5 mg BID, using minimally.  PHq2:0 GAD7:0   Migraine/tension headaches: rarely using imitrex .    She has been working again on healthy eating.. plan to restart exercise.  Wt Readings from Last 3 Encounters:  01/07/24 198 lb 6 oz (90 kg)  12/21/23 195 lb (88.5 kg)  01/30/23 186 lb (84.4 kg)  Body mass index is 32.02 kg/m.   Reviewed labs in detail with pt.  Lab Results  Component Value Date   CHOL 155 12/10/2023   HDL 50.90 12/10/2023   LDLCALC 89 12/10/2023   TRIG 73.0 12/10/2023   CHOLHDL 3 12/10/2023   The 10-year ASCVD risk score (Arnett DK, et al., 2019) is: 0.4%   Values used to calculate the score:     Age: 59 years     Sex: Female     Is Non-Hispanic African American: Yes     Diabetic: No     Tobacco smoker: No     Systolic Blood Pressure: 110 mmHg     Is BP treated: No     HDL Cholesterol: 50.9 mg/dL     Total Cholesterol: 155  mg/dL   Relevant past medical, surgical, family and social history reviewed and updated as indicated. Interim medical history since our last visit reviewed. Allergies and medications reviewed and updated. Outpatient Medications Prior to Visit  Medication Sig Dispense Refill   cetirizine (ZYRTEC) 10 MG tablet Take 10 mg by mouth at bedtime as needed for allergies.      citalopram  (CELEXA ) 40 MG tablet TAKE 1 TABLET BY MOUTH EVERY DAY 90 tablet 0   levalbuterol  (XOPENEX  HFA) 45 MCG/ACT inhaler INHALE ONE TO TWO PUFFS INTO LUNGS EVERY 4 HOURS AS NEEDED 15 g 5   levalbuterol  (XOPENEX ) 0.63 MG/3ML nebulizer solution Take 3 mLs (0.63 mg total) by nebulization every 4 (four) hours as needed for wheezing or shortness of breath. 3 mL 2   LORazepam  (ATIVAN ) 0.5 MG tablet Take 0.5 mg by mouth 2 (two) times daily as needed for anxiety.     montelukast  (SINGULAIR ) 10 MG tablet TAKE 1 TABLET BY MOUTH EVERYDAY AT BEDTIME 90 tablet 0   Multiple Vitamin (MULTIVITAMIN WITH MINERALS) TABS tablet Take 1 tablet by mouth daily.     SUMAtriptan  (IMITREX ) 25 MG tablet Take  2 tablets (50 mg total) by mouth every 2 (two) hours as needed for migraine. 10 tablet 2   LORazepam  (ATIVAN ) 0.5 MG tablet Take 1 tablet (0.5 mg total) by mouth 2 (two) times daily. (Patient taking differently: Take 0.5 mg by mouth 2 (two) times daily as needed for anxiety.) 60 tablet 5   doxycycline  (VIBRA -TABS) 100 MG tablet Take 1 tablet (100 mg total) by mouth 2 (two) times daily. 20 tablet 0   valACYclovir  (VALTREX ) 1000 MG tablet Take 1 tablet (1,000 mg total) by mouth 3 (three) times daily. 21 tablet 0   No facility-administered medications prior to visit.     Per HPI unless specifically indicated in ROS section below Review of Systems  Constitutional:  Negative for fatigue and fever.  HENT:  Negative for congestion.   Eyes:  Negative for pain.  Respiratory:  Negative for cough and shortness of breath.   Cardiovascular:  Positive for  palpitations. Negative for chest pain and leg swelling.  Gastrointestinal:  Negative for abdominal pain.  Genitourinary:  Negative for dysuria and vaginal bleeding.  Musculoskeletal:  Negative for back pain.  Neurological:  Negative for syncope, light-headedness and headaches.  Psychiatric/Behavioral:  Negative for dysphoric mood.    Objective:  BP 110/68   Pulse (!) 104   Temp 98 F (36.7 C) (Temporal)   Ht 5\' 6"  (1.676 m)   Wt 198 lb 6 oz (90 kg)   LMP 04/30/2017 (Exact Date)   SpO2 98%   BMI 32.02 kg/m   Wt Readings from Last 3 Encounters:  01/07/24 198 lb 6 oz (90 kg)  12/21/23 195 lb (88.5 kg)  01/30/23 186 lb (84.4 kg)      Physical Exam Constitutional:      General: She is not in acute distress.    Appearance: Normal appearance. She is well-developed. She is not ill-appearing or toxic-appearing.  HENT:     Head: Normocephalic.     Right Ear: Hearing, tympanic membrane, ear canal and external ear normal. Tympanic membrane is not erythematous, retracted or bulging.     Left Ear: Hearing, tympanic membrane, ear canal and external ear normal. Tympanic membrane is not erythematous, retracted or bulging.     Nose: No mucosal edema or rhinorrhea.     Right Sinus: No maxillary sinus tenderness or frontal sinus tenderness.     Left Sinus: No maxillary sinus tenderness or frontal sinus tenderness.     Mouth/Throat:     Pharynx: Uvula midline.  Eyes:     General: Lids are normal. Lids are everted, no foreign bodies appreciated.     Conjunctiva/sclera: Conjunctivae normal.     Pupils: Pupils are equal, round, and reactive to light.  Neck:     Thyroid : No thyroid  mass or thyromegaly.     Vascular: No carotid bruit.     Trachea: Trachea normal.  Cardiovascular:     Rate and Rhythm: Normal rate and regular rhythm.     Pulses: Normal pulses.     Heart sounds: Normal heart sounds, S1 normal and S2 normal. No murmur heard.    No friction rub. No gallop.  Pulmonary:     Effort:  Pulmonary effort is normal. No tachypnea or respiratory distress.     Breath sounds: Normal breath sounds. No decreased breath sounds, wheezing, rhonchi or rales.  Abdominal:     General: Bowel sounds are normal.     Palpations: Abdomen is soft.     Tenderness: There is no abdominal tenderness.  Musculoskeletal:     Cervical back: Normal range of motion and neck supple.  Skin:    General: Skin is warm and dry.     Findings: No rash.  Neurological:     Mental Status: She is alert.  Psychiatric:        Mood and Affect: Mood is not anxious or depressed.        Speech: Speech normal.        Behavior: Behavior normal. Behavior is cooperative.        Thought Content: Thought content normal.        Judgment: Judgment normal.       Results for orders placed or performed in visit on 12/10/23  VITAMIN D  25 Hydroxy (Vit-D Deficiency, Fractures)   Collection Time: 12/10/23  9:02 AM  Result Value Ref Range   VITD 46.38 30.00 - 100.00 ng/mL  Lipid panel   Collection Time: 12/10/23  9:02 AM  Result Value Ref Range   Cholesterol 155 0 - 200 mg/dL   Triglycerides 32.4 0.0 - 149.0 mg/dL   HDL 40.10 >27.25 mg/dL   VLDL 36.6 0.0 - 44.0 mg/dL   LDL Cholesterol 89 0 - 99 mg/dL   Total CHOL/HDL Ratio 3    NonHDL 103.69   Comprehensive metabolic panel with GFR   Collection Time: 12/10/23  9:02 AM  Result Value Ref Range   Sodium 138 135 - 145 mEq/L   Potassium 4.6 3.5 - 5.1 mEq/L   Chloride 103 96 - 112 mEq/L   CO2 29 19 - 32 mEq/L   Glucose, Bld 92 70 - 99 mg/dL   BUN 10 6 - 23 mg/dL   Creatinine, Ser 3.47 0.40 - 1.20 mg/dL   Total Bilirubin 0.4 0.2 - 1.2 mg/dL   Alkaline Phosphatase 50 39 - 117 U/L   AST 21 0 - 37 U/L   ALT 20 0 - 35 U/L   Total Protein 6.9 6.0 - 8.3 g/dL   Albumin 4.1 3.5 - 5.2 g/dL   GFR 42.59 >56.38 mL/min   Calcium 9.2 8.4 - 10.5 mg/dL    This visit occurred during the SARS-CoV-2 public health emergency.  Safety protocols were in place, including screening  questions prior to the visit, additional usage of staff PPE, and extensive cleaning of exam room while observing appropriate contact time as indicated for disinfecting solutions.   COVID 19 screen:  No recent travel or known exposure to COVID19 The patient denies respiratory symptoms of COVID 19 at this time. The importance of social distancing was discussed today.   Assessment and Plan   The patient's preventative maintenance and recommended screening tests for an annual wellness exam were reviewed in full today. Brought up to date unless services declined.  Counselled on the importance of diet, exercise, and its role in overall health and mortality. The patient's FH and SH was reviewed, including their home life, tobacco status, and drug and alcohol status.   Vaccines: Tdap DUE, COVID x3 Pap/DVE:  not indicated, s/p hysterectomy Mammo: 08/2022  abnormal.. nml on right breast biopsy, repeat in 6 months. OVERDUE for diagnostic. Colon:  no early family history.Aaron Aas PGF  age 59s Smoking Status: none ETOH/ drug use: none/none  Hep C:  due  HIV screen:   refused  Problem List Items Addressed This Visit     Essential hypertension   Well controlled on no meds.      GAD (generalized anxiety disorder)   Stable, chronic.  Continue current medication.  Celexa  40 mg daily  Lorazepam  rarely      Relevant Medications   LORazepam  (ATIVAN ) 0.5 MG tablet   Mild intermittent asthma   Stable, chronic.  Continue current medication.  zyrtec, nasal steroid,singulair .        Paroxysmal tachycardia (HCC)   Chronic stable  Heart rate is elevated in the office today.   Occasional episodes of palpitation otherwise asymptomatic. Not using a decongestant and limiting caffeine. Consider repeat thyroid  function or ZIO monitor in future.      Other Visit Diagnoses       Routine general medical examination at a health care facility    -  Primary     Need for tetanus booster       Relevant  Orders   Td : Tetanus/diphtheria >7yo Preservative  free (Completed)     Abnormal mammogram       Relevant Orders   MM 3D DIAGNOSTIC MAMMOGRAM BILATERAL BREAST       Herby Lolling, MD

## 2024-01-07 NOTE — Assessment & Plan Note (Signed)
Well controlled on no meds 

## 2024-01-07 NOTE — Patient Instructions (Signed)
Please call the location of your choice from the menu below to schedule your Mammogram and/or Bone Density appointment.    [facility]   Phone:  [phone]   [address]                                                                            [place], [place] [address]                                            Services: 3D Mammogram and Bone Density  [facility] [place] ([facility])  Phone:  [phone]   [address]                        [place], [place] [address]                                              Services:  3D Mammogram and Bone Density  

## 2024-01-08 ENCOUNTER — Encounter (HOSPITAL_COMMUNITY): Payer: Self-pay

## 2024-01-09 ENCOUNTER — Encounter: Payer: Self-pay | Admitting: Family Medicine

## 2024-01-09 DIAGNOSIS — J4520 Mild intermittent asthma, uncomplicated: Secondary | ICD-10-CM

## 2024-01-11 MED ORDER — LEVALBUTEROL TARTRATE 45 MCG/ACT IN AERO: INHALATION_SPRAY | RESPIRATORY_TRACT | 5 refills | Status: AC

## 2024-01-11 MED ORDER — LORAZEPAM 0.5 MG PO TABS: 0.5000 mg | ORAL_TABLET | Freq: Two times a day (BID) | ORAL | 0 refills | Status: AC | PRN

## 2024-01-11 NOTE — Telephone Encounter (Signed)
 Please advise about thyroid  labs.  Last office visit 05/08/025 for CPE. Lorazepam  last refilled ?  Next Appt: No future appointments.

## 2024-01-15 ENCOUNTER — Ambulatory Visit
Admission: RE | Admit: 2024-01-15 | Discharge: 2024-01-15 | Disposition: A | Discharge status: Home / Self Care | Source: Ambulatory Visit | Attending: Family Medicine | Admitting: Family Medicine

## 2024-01-15 ENCOUNTER — Ambulatory Visit: Payer: Self-pay | Admitting: Family Medicine

## 2024-01-15 DIAGNOSIS — R928 Other abnormal and inconclusive findings on diagnostic imaging of breast: Secondary | ICD-10-CM | POA: Diagnosis not present

## 2024-01-15 DIAGNOSIS — R92333 Mammographic heterogeneous density, bilateral breasts: Secondary | ICD-10-CM | POA: Diagnosis not present

## 2024-01-15 DIAGNOSIS — R921 Mammographic calcification found on diagnostic imaging of breast: Secondary | ICD-10-CM | POA: Diagnosis not present

## 2024-01-22 ENCOUNTER — Telehealth (INDEPENDENT_AMBULATORY_CARE_PROVIDER_SITE_OTHER): Admitting: Family Medicine

## 2024-01-22 ENCOUNTER — Encounter: Payer: Self-pay | Admitting: Family Medicine

## 2024-01-22 VITALS — Ht 66.0 in

## 2024-01-22 DIAGNOSIS — J0100 Acute maxillary sinusitis, unspecified: Secondary | ICD-10-CM | POA: Insufficient documentation

## 2024-01-22 MED ORDER — PREDNISONE 20 MG PO TABS: ORAL_TABLET | ORAL | 0 refills | Status: DC

## 2024-01-22 MED ORDER — AZITHROMYCIN 250 MG PO TABS: ORAL_TABLET | ORAL | 0 refills | Status: DC

## 2024-01-22 NOTE — Assessment & Plan Note (Signed)
 Acute, most likely initial allergies now with changes including facial pain, change of mucus and low-grade temperature suggestive of bacterial superinfection. Will treat with azithromycin  given penicillin allergy.  If pressure not improving or if asthma beginning to act up she will complete prednisone  taper. At the moment there are no symptoms of asthma exacerbation.  Return and ER precautions provided.

## 2024-01-22 NOTE — Progress Notes (Signed)
 VIRTUAL VISIT A virtual visit is felt to be most appropriate for this patient at this time.   I connected with the patient on 01/22/24 at 12:00 PM EDT by virtual telehealth platform and verified that I am speaking with the correct person using two identifiers.   I discussed the limitations, risks, security and privacy concerns of performing an evaluation and management service by  virtual telehealth platform and the availability of in person appointments. I also discussed with the patient that there may be a patient responsible charge related to this service. The patient expressed understanding and agreed to proceed.  Patient location: Home Provider Location: [facility] [place] Participants: Mackenzie Shea   Chief Complaint  Patient presents with   Nasal Congestion   Head Pressure    History of Present Illness:  43 y.o. female patient of Mackenzie Mathieson Shea, Mackenzie Shea presents with sinus congestion  Date of onset: 1 week ago Initial symptoms included PND and ST...  nasal congestion, started with allergy symptoms Symptoms progressed to pain in face, teeth sore  Subjective fever  Mild soreness in right ear.  NO cough, no SOB.   Sick contacts: child COVID testing:   none     She has tried to treat with zyrtec, claritin and nasal steroid     MIld intermittent asthma...  controlled on Singulair , no requiring xopenex  Non-smoker.   No recent antibiotics.  COVID 19 screen No recent travel or known exposure to COVID19 The patient denies respiratory symptoms of COVID 19 at this time.  The importance of social distancing was discussed today.   Review of Systems  Constitutional:  Positive for fever and malaise/fatigue. Negative for chills.  HENT:  Positive for congestion, ear pain, sinus pain and sore throat.   Eyes:  Negative for pain and redness.  Respiratory:  Negative for cough, shortness of breath and wheezing.   Cardiovascular:  Negative for chest pain, palpitations  and leg swelling.  Gastrointestinal:  Negative for abdominal pain, blood in stool, constipation, diarrhea, nausea and vomiting.  Genitourinary:  Negative for dysuria.  Musculoskeletal:  Negative for falls and myalgias.  Skin:  Negative for rash.  Neurological:  Negative for dizziness.  Psychiatric/Behavioral:  Negative for depression. The patient is not nervous/anxious.       Past Medical History:  Diagnosis Date   Allergic rhinitis, cause unspecified    Allergy    Anemia    history of   Anxiety    Elevated blood pressure reading without diagnosis of hypertension    Esophageal reflux    Extrinsic asthma, unspecified    Leukocytosis, unspecified    Pain in joint, shoulder region    Personal history of traumatic fracture    Tachycardia    with anxiety   Unspecified vitamin D  deficiency     reports that she has never smoked. She has never used smokeless tobacco. She reports that she does not drink alcohol and does not use drugs.   Current Outpatient Medications:    azithromycin  (ZITHROMAX ) 250 MG tablet, 2 tab po x 1 day then 1 tab po daily, Disp: 6 tablet, Rfl: 0   cetirizine (ZYRTEC) 10 MG tablet, Take 10 mg by mouth at bedtime as needed for allergies. , Disp: , Rfl:    citalopram  (CELEXA ) 40 MG tablet, TAKE 1 TABLET BY MOUTH EVERY DAY, Disp: 90 tablet, Rfl: 0   levalbuterol  (XOPENEX  HFA) 45 MCG/ACT inhaler, INHALE ONE TO TWO PUFFS INTO LUNGS EVERY 4 HOURS AS  NEEDED, Disp: 15 g, Rfl: 5   levalbuterol  (XOPENEX ) 0.63 MG/3ML nebulizer solution, Take 3 mLs (0.63 mg total) by nebulization every 4 (four) hours as needed for wheezing or shortness of breath., Disp: 3 mL, Rfl: 2   LORazepam  (ATIVAN ) 0.5 MG tablet, Take 1 tablet (0.5 mg total) by mouth 2 (two) times daily as needed for anxiety., Disp: 30 tablet, Rfl: 0   montelukast  (SINGULAIR ) 10 MG tablet, TAKE 1 TABLET BY MOUTH EVERYDAY AT BEDTIME, Disp: 90 tablet, Rfl: 0   Multiple Vitamin (MULTIVITAMIN WITH MINERALS) TABS tablet, Take 1  tablet by mouth daily., Disp: , Rfl:    predniSONE  (DELTASONE ) 20 MG tablet, 3 tabs by mouth daily x 3 days, then 2 tabs by mouth daily x 2 days then 1 tab by mouth daily x 2 days, Disp: 15 tablet, Rfl: 0   SUMAtriptan  (IMITREX ) 25 MG tablet, Take 2 tablets (50 mg total) by mouth every 2 (two) hours as needed for migraine., Disp: 10 tablet, Rfl: 2   Observations/Objective: Height 5\' 6"  (1.676 m), last menstrual period 04/30/2017.  Physical Exam Constitutional:      General: The patient is not in acute distress. Pulmonary:     Effort: Pulmonary effort is normal. No respiratory distress.  Neurological:     Mental Status: The patient is alert and oriented to person, place, and time.  Psychiatric:        Mood and Affect: Mood normal.        Behavior: Behavior normal.    Assessment and Plan Acute non-recurrent maxillary sinusitis Assessment & Plan: Acute, most likely initial allergies now with changes including facial pain, change of mucus and low-grade temperature suggestive of bacterial superinfection. Will treat with azithromycin  given penicillin allergy.  If pressure not improving or if asthma beginning to act up she will complete prednisone  taper. At the moment there are no symptoms of asthma exacerbation.  Return and ER precautions provided.   Other orders -     Azithromycin ; 2 tab po x 1 day then 1 tab po daily  Dispense: 6 tablet; Refill: 0 -     predniSONE ; 3 tabs by mouth daily x 3 days, then 2 tabs by mouth daily x 2 days then 1 tab by mouth daily x 2 days  Dispense: 15 tablet; Refill: 0      I discussed the assessment and treatment plan with the patient. The patient was provided an opportunity to ask questions and all were answered. The patient agreed with the plan and demonstrated an understanding of the instructions.   The patient was advised to call back or seek an in-person evaluation if the symptoms worsen or if the condition fails to improve as anticipated.      Mackenzie Lolling, Mackenzie Shea

## 2024-02-25 ENCOUNTER — Other Ambulatory Visit: Payer: Self-pay | Admitting: Family Medicine

## 2024-02-25 DIAGNOSIS — J4540 Moderate persistent asthma, uncomplicated: Secondary | ICD-10-CM

## 2024-02-25 DIAGNOSIS — F411 Generalized anxiety disorder: Secondary | ICD-10-CM

## 2024-06-09 ENCOUNTER — Encounter: Payer: Self-pay | Admitting: Family Medicine

## 2024-06-09 DIAGNOSIS — G43909 Migraine, unspecified, not intractable, without status migrainosus: Secondary | ICD-10-CM

## 2024-06-09 MED ORDER — SUMATRIPTAN SUCCINATE 25 MG PO TABS: 50.0000 mg | ORAL_TABLET | ORAL | 2 refills | Status: AC | PRN

## 2024-06-09 NOTE — Telephone Encounter (Signed)
 Last refill given 05/03/20. Ok to refill as requested?

## 2024-06-28 ENCOUNTER — Telehealth: Admitting: Family Medicine

## 2024-06-28 ENCOUNTER — Ambulatory Visit: Payer: Self-pay

## 2024-06-28 ENCOUNTER — Encounter: Payer: Self-pay | Admitting: Family Medicine

## 2024-06-28 VITALS — BP 126/78 | HR 88 | Temp 98.2°F | Ht 66.0 in | Wt 192.4 lb

## 2024-06-28 DIAGNOSIS — J0100 Acute maxillary sinusitis, unspecified: Secondary | ICD-10-CM

## 2024-06-28 MED ORDER — PREDNISONE 10 MG PO TABS: ORAL_TABLET | ORAL | 0 refills | Status: AC

## 2024-06-28 MED ORDER — AZITHROMYCIN 250 MG PO TABS: ORAL_TABLET | ORAL | 0 refills | Status: AC

## 2024-06-28 NOTE — Telephone Encounter (Signed)
 Please triage

## 2024-06-28 NOTE — Telephone Encounter (Signed)
 I called and moved patient to Dr. Avelina today at 2:40 pm.

## 2024-06-28 NOTE — Progress Notes (Signed)
 VIRTUAL VISIT A virtual visit is felt to be most appropriate for this patient at this time.   I connected with the patient on 06/28/24 at  2:40 PM EDT by virtual telehealth platform and verified that I am speaking with the correct person using two identifiers.   I discussed the limitations, risks, security and privacy concerns of performing an evaluation and management service by  virtual telehealth platform and the availability of in person appointments. I also discussed with the patient that there may be a patient responsible charge related to this service. The patient expressed understanding and agreed to proceed.  Patient location: Home Provider Location: [place] Participants: Greig Ring and Sharene JONELLE Shoulder   Chief Complaint  Patient presents with   Nasal Congestion    Started Last Wednesday    Sinus Drainage    Yellowish    History of Present Illness:  43 y.o. female patient of Bennett Vanscyoc E, MD presents with  sinus pressure   Date of onset: 1 week ago Initial symptoms included  scratchy throat, post nasaal drip Symptoms progressed to nasal congestion, acute cough, PND. Felt chills and warm.. low grade temp 3 days ago   Occ exertional  SOB, no wheeze. Mucus cahnging to yellowish color.  Has fatigue.  Facial pain in right above eye and maxillary area.   Sick contacts:  Home Health Nurse COVID testing:   none    She has tried to treat with  multiple OTC meds, allergy meds.  Occ using Xopenex .    Mild intermittent asthma Non-smoker.   PCN allergy     COVID 19 screen No recent travel or known exposure to COVID19 The patient denies respiratory symptoms of COVID 19 at this time.  The importance of social distancing was discussed today.   Review of Systems  Constitutional:  Negative for chills and fever.  HENT:  Positive for congestion, sinus pain and sore throat. Negative for ear pain.   Eyes:  Negative for pain and redness.  Respiratory:  Positive  for cough. Negative for shortness of breath.   Cardiovascular:  Negative for chest pain, palpitations and leg swelling.  Gastrointestinal:  Negative for abdominal pain, blood in stool, constipation, diarrhea, nausea and vomiting.  Genitourinary:  Negative for dysuria.  Musculoskeletal:  Negative for falls and myalgias.  Skin:  Negative for rash.  Neurological:  Negative for dizziness.  Psychiatric/Behavioral:  Negative for depression. The patient is not nervous/anxious.       Past Medical History:  Diagnosis Date   Allergic rhinitis, cause unspecified    Allergy    Anemia    history of   Anxiety    Elevated blood pressure reading without diagnosis of hypertension    Esophageal reflux    Extrinsic asthma, unspecified    Leukocytosis, unspecified    Pain in joint, shoulder region    Personal history of traumatic fracture    Tachycardia    with anxiety   Unspecified vitamin D  deficiency     reports that she has never smoked. She has never used smokeless tobacco. She reports that she does not drink alcohol and does not use drugs.   Current Outpatient Medications:    azithromycin  (ZITHROMAX ) 250 MG tablet, Take 2 tablets on day 1, then 1 tablet daily on days 2 through 5, Disp: 6 tablet, Rfl: 0   cetirizine (ZYRTEC) 10 MG tablet, Take 10 mg by mouth at bedtime as needed for allergies. , Disp: , Rfl:  citalopram  (CELEXA ) 40 MG tablet, TAKE 1 TABLET BY MOUTH EVERY DAY, Disp: 90 tablet, Rfl: 1   levalbuterol  (XOPENEX  HFA) 45 MCG/ACT inhaler, INHALE ONE TO TWO PUFFS INTO LUNGS EVERY 4 HOURS AS NEEDED, Disp: 15 g, Rfl: 5   levalbuterol  (XOPENEX ) 0.63 MG/3ML nebulizer solution, Take 3 mLs (0.63 mg total) by nebulization every 4 (four) hours as needed for wheezing or shortness of breath., Disp: 3 mL, Rfl: 2   LORazepam  (ATIVAN ) 0.5 MG tablet, Take 1 tablet (0.5 mg total) by mouth 2 (two) times daily as needed for anxiety., Disp: 30 tablet, Rfl: 0   montelukast  (SINGULAIR ) 10 MG tablet, TAKE  1 TABLET BY MOUTH EVERYDAY AT BEDTIME, Disp: 90 tablet, Rfl: 3   Multiple Vitamin (MULTIVITAMIN WITH MINERALS) TABS tablet, Take 1 tablet by mouth daily., Disp: , Rfl:    predniSONE  (DELTASONE ) 10 MG tablet, 3 tabs by mouth daily x 3 days, then 2 tabs by mouth daily x 2 days then 1 tab by mouth daily x 2 days, Disp: 15 tablet, Rfl: 0   SUMAtriptan  (IMITREX ) 25 MG tablet, Take 2 tablets (50 mg total) by mouth every 2 (two) hours as needed for migraine., Disp: 10 tablet, Rfl: 2   Observations/Objective: Blood pressure 126/78, pulse 88, temperature 98.2 F (36.8 C), temperature source Oral, height 5' 6 (1.676 m), weight 192 lb 6 oz (87.3 kg), last menstrual period 04/30/2017, SpO2 99%.  Physical Exam Constitutional:      General: The patient is not in acute distress. Pulmonary:     Effort: Pulmonary effort is normal. No respiratory distress.  Neurological:     Mental Status: The patient is alert and oriented to person, place, and time.  Psychiatric:        Mood and Affect: Mood normal.        Behavior: Behavior normal.    Assessment and Plan Acute non-recurrent maxillary sinusitis Assessment & Plan: Acute, most likely initial allergies now with changes including facial pain, change of mucus and low-grade temperature suggestive of bacterial superinfection. Will treat with azithromycin  given penicillin allergy. She will complete prednisone  taper as well. At the moment there are no symptoms of asthma exacerbation.  Return and ER precautions provided.   Other orders -     predniSONE ; 3 tabs by mouth daily x 3 days, then 2 tabs by mouth daily x 2 days then 1 tab by mouth daily x 2 days  Dispense: 15 tablet; Refill: 0 -     Azithromycin ; Take 2 tablets on day 1, then 1 tablet daily on days 2 through 5  Dispense: 6 tablet; Refill: 0      I discussed the assessment and treatment plan with the patient. The patient was provided an opportunity to ask questions and all were answered. The  patient agreed with the plan and demonstrated an understanding of the instructions.   The patient was advised to call back or seek an in-person evaluation if the symptoms worsen or if the condition fails to improve as anticipated.     Greig Ring, MD

## 2024-06-28 NOTE — Telephone Encounter (Signed)
 I have scheduled patient with Dr. Avelina today at 2:40 pm.

## 2024-06-28 NOTE — Telephone Encounter (Signed)
 Copied from CRM [id number]. Topic: Clinical - Red Word Triage >> Jun 28, 2024  8:41 AM Harlene ORN wrote: Red Word that prompted transfer to Nurse Triage: patient has congestion that's gotten worse in a week. Says she's asthmatic, and is having difficulty breathing

## 2024-06-28 NOTE — Telephone Encounter (Unsigned)
 Copied from CRM [id number]. Topic: Clinical - Refused Triage >> Jun 28, 2024  8:42 AM Harlene ORN wrote: Patient/caller voiced complaints of patient that has congestion that's gotten worse in a week. Says she's asthmatic, but is having difficulty breathing. Declined transfer to triage.

## 2024-06-28 NOTE — Assessment & Plan Note (Signed)
 Acute, most likely initial allergies now with changes including facial pain, change of mucus and low-grade temperature suggestive of bacterial superinfection. Will treat with azithromycin  given penicillin allergy. She will complete prednisone  taper as well. At the moment there are no symptoms of asthma exacerbation.  Return and ER precautions provided.

## 2024-06-28 NOTE — Telephone Encounter (Signed)
 FYI Only or Action Required?: FYI only for provider.  Patient was last seen in primary care on 01/22/2024 by Avelina Greig BRAVO, MD.  Called Nurse Triage reporting Nasal Congestion.  Triage Disposition: See Physician Within 24 Hours  Patient/caregiver understands and will follow disposition?: Yes     Copied from CRM [id number]. Topic: Clinical - Red Word Triage >> Jun 28, 2024  8:41 AM Harlene ORN wrote: Red Word that prompted transfer to Nurse Triage: patient has congestion that's gotten worse in a week. Says she's asthmatic, and is having difficulty breathing       Reason for Disposition  Nursing judgment or information in reference  Answer Assessment - Initial Assessment Questions Patient has declined triage, stating that she has a virtual appointment tomorrow.  Protocols used: No Guideline Available-[facility]

## 2024-06-28 NOTE — Telephone Encounter (Signed)
 I spoke with pt; I explained why I was calling and wanted to get some additional info about pts symptoms. 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 I apologized to pt and gave UC & ED precautions which pt voiced understanding. Sending note to Dr Watt who is out of office today and Dr Avelina who is in office today.also sending note to [place] pooi.

## 2024-06-29 ENCOUNTER — Ambulatory Visit: Admitting: Family Medicine

## 2024-09-03 ENCOUNTER — Other Ambulatory Visit: Payer: Self-pay | Admitting: Family Medicine

## 2024-09-03 DIAGNOSIS — F411 Generalized anxiety disorder: Secondary | ICD-10-CM
